# Patient Record
Sex: Female | Born: 1980 | Hispanic: No | Marital: Married | Smoking: Never smoker
Health system: Southern US, Community
[De-identification: ages and names within clinical notes are randomized; demographics above are authoritative.]

## PROBLEM LIST (undated history)

## (undated) DIAGNOSIS — N611 Abscess of the breast and nipple: Secondary | ICD-10-CM

## (undated) DIAGNOSIS — O24419 Gestational diabetes mellitus in pregnancy, unspecified control: Secondary | ICD-10-CM

## (undated) HISTORY — PX: BREAST SURGERY: SHX581

---

## 2010-09-02 ENCOUNTER — Ambulatory Visit (HOSPITAL_COMMUNITY): Admission: RE | Admit: 2010-09-02 | Discharge: 2010-09-02 | Payer: Self-pay | Admitting: Obstetrics

## 2011-09-15 ENCOUNTER — Other Ambulatory Visit: Payer: Self-pay

## 2011-09-15 LAB — RPR: RPR: NONREACTIVE

## 2011-09-15 LAB — HIV ANTIBODY (ROUTINE TESTING W REFLEX): HIV: NONREACTIVE

## 2011-09-15 LAB — VARICELLA ZOSTER ANTIBODY, IGG: Varicella: IMMUNE

## 2011-09-15 NOTE — Progress Notes (Signed)
LABS DRAWN FOR NEW OB CNEWSOME

## 2011-09-16 LAB — OBSTETRIC PANEL
Eosinophils Absolute: 0.5 10*3/uL (ref 0.0–0.7)
HCT: 36.5 % (ref 36.0–46.0)
Hepatitis B Surface Ag: NEGATIVE
Lymphocytes Relative: 19 % (ref 12–46)
Lymphs Abs: 1.6 10*3/uL (ref 0.7–4.0)
MCHC: 33.2 g/dL (ref 30.0–36.0)
MCV: 89.5 fL (ref 78.0–100.0)
Monocytes Relative: 7 % (ref 3–12)
Neutro Abs: 5.7 10*3/uL (ref 1.7–7.7)
RDW: 14.7 % (ref 11.5–15.5)
Rh Type: POSITIVE
Rubella: 87 IU/mL — ABNORMAL HIGH

## 2011-09-22 ENCOUNTER — Encounter: Payer: Self-pay | Admitting: Family Medicine

## 2011-09-22 ENCOUNTER — Ambulatory Visit (INDEPENDENT_AMBULATORY_CARE_PROVIDER_SITE_OTHER): Payer: Self-pay | Admitting: Family Medicine

## 2011-09-22 NOTE — Patient Instructions (Signed)
I'm sorry that we do not have a female physician for you to see today. We have re-scheduled your new OB appointment with Dr. Madolyn Frieze on October 29 at 1:45 pm.

## 2011-09-22 NOTE — Progress Notes (Signed)
Pt did not wish to be seen by a female provider.  Have rescheduled her with Dr. Madolyn Frieze.

## 2011-10-09 ENCOUNTER — Encounter: Payer: Self-pay | Admitting: Family Medicine

## 2011-10-09 ENCOUNTER — Ambulatory Visit (INDEPENDENT_AMBULATORY_CARE_PROVIDER_SITE_OTHER): Payer: Self-pay | Admitting: Family Medicine

## 2011-10-09 VITALS — BP 98/62 | Temp 98.6°F | Wt 131.0 lb

## 2011-10-09 DIAGNOSIS — Z3480 Encounter for supervision of other normal pregnancy, unspecified trimester: Secondary | ICD-10-CM

## 2011-10-09 DIAGNOSIS — Z3490 Encounter for supervision of normal pregnancy, unspecified, unspecified trimester: Secondary | ICD-10-CM

## 2011-10-09 LAB — GLUCOSE, CAPILLARY: Glucose-Capillary: 164 mg/dL — ABNORMAL HIGH (ref 70–99)

## 2011-10-09 NOTE — Progress Notes (Signed)
Addended by: Herminio Heads on: 10/09/2011 02:51 PM   Modules accepted: Orders

## 2011-10-09 NOTE — Progress Notes (Signed)
This is a G2 at unknown gestational age presenting for her initial visit.  1. Will order ultrasound for anatomy and especially for dates. Patient was breastfeeding at the time she found she was pregnant at the end of August (pregnancy test done at [facility]) and had not had any periods following delivery of her baby January 2012. Fetal heart tones heard and about 17 weeks based on fundal height. 2. No pap smear. Done for her last pregnancy about a year ago, and it was normal. 3. Will get urine GC/Chlamydia today.  4. Does not want genetic testing. 5. Will get 1 hour glucola today due to history of diet-controlled gestational diabetes. 6. Baby will be followed by our clinic here.  7. Flu shot today.  8. Follow-up in 4 weeks at [facility] clinic.   Precepted with attending Dr. Sarah Swaz[place]and.

## 2011-10-09 NOTE — Progress Notes (Signed)
Addended by: [place], Marcella Dunnaway on: 10/09/2011 03:22 PM   Modules accepted: Orders

## 2011-10-09 NOTE — Patient Instructions (Addendum)
It was very nice to meet you.  We will schedule you for an ultrasound to find out your due date and your baby's sex.   Please follow-up in 4 weeks at the [facility] clinic.   Return Tues 10-17-11 @ 8:30 for 3 hr gtt

## 2011-10-10 LAB — GC/CHLAMYDIA PROBE AMP, URINE
Chlamydia, Swab/Urine, PCR: NEGATIVE
GC Probe Amp, Urine: NEGATIVE

## 2011-10-11 ENCOUNTER — Other Ambulatory Visit: Payer: Self-pay | Admitting: Family Medicine

## 2011-10-11 ENCOUNTER — Ambulatory Visit (HOSPITAL_COMMUNITY)
Admission: RE | Admit: 2011-10-11 | Discharge: 2011-10-11 | Disposition: A | Discharge status: Home / Self Care | Payer: Self-pay | Source: Ambulatory Visit | Attending: Family Medicine | Admitting: Family Medicine

## 2011-10-11 DIAGNOSIS — O358XX0 Maternal care for other (suspected) fetal abnormality and damage, not applicable or unspecified: Secondary | ICD-10-CM | POA: Insufficient documentation

## 2011-10-11 DIAGNOSIS — Z363 Encounter for antenatal screening for malformations: Secondary | ICD-10-CM | POA: Insufficient documentation

## 2011-10-11 DIAGNOSIS — Z3490 Encounter for supervision of normal pregnancy, unspecified, unspecified trimester: Secondary | ICD-10-CM

## 2011-10-11 DIAGNOSIS — Z1389 Encounter for screening for other disorder: Secondary | ICD-10-CM | POA: Insufficient documentation

## 2011-10-17 ENCOUNTER — Other Ambulatory Visit: Payer: Self-pay | Admitting: Family Medicine

## 2011-10-17 ENCOUNTER — Other Ambulatory Visit: Payer: Self-pay

## 2011-10-17 DIAGNOSIS — Z3480 Encounter for supervision of other normal pregnancy, unspecified trimester: Secondary | ICD-10-CM

## 2011-10-17 NOTE — Progress Notes (Signed)
3 hr gtt done today Brenda Solomon 

## 2011-10-18 LAB — GLUCOSE TOLERANCE, 3 HOURS: Glucose Tolerance, 1 hour: 186 mg/dL (ref 70–189)

## 2011-11-06 ENCOUNTER — Ambulatory Visit (INDEPENDENT_AMBULATORY_CARE_PROVIDER_SITE_OTHER): Payer: Self-pay | Admitting: Family Medicine

## 2011-11-06 ENCOUNTER — Encounter: Payer: Self-pay | Admitting: Family Medicine

## 2011-11-06 VITALS — BP 101/70 | Wt 131.0 lb

## 2011-11-06 DIAGNOSIS — O4440 Low lying placenta NOS or without hemorrhage, unspecified trimester: Secondary | ICD-10-CM

## 2011-11-06 DIAGNOSIS — O4410 Placenta previa with hemorrhage, unspecified trimester: Secondary | ICD-10-CM

## 2011-11-06 NOTE — Patient Instructions (Addendum)
Follow-up in 4 weeks at the [facility] clinic.  We will schedule a repeat ultrasound for you to look at the placenta.  We will talk about trying a vaginal delivery at our next visit.  Results from your sugar test: Glucose, Fasting-Gestational 79 70 - 104 mg/dL Glucose  1 Hour-Gestational 186 70 - 189 mg/dL Glucose 2 Hour-Gestational 143 70 - 164 mg/dL Glucose GTT - 3 Hour 413 70 - 144 mg/dL

## 2011-11-06 NOTE — Progress Notes (Signed)
This is a G2P1 @ 22 3/7 week based on 18 week ultrasound.  1. Found to have low-lying placenta. Will check repeat U/S in 6-8 weeks. 2. Patient would like TOLAC if possible (First delivery was C-section secondary to infant being breech). Will refer to [facility] around 30 weeks to be evaluated for this.  3. Follow-up at [facility] clinic in 4 weeks.  4. Regarding diet, recommend low carbohydrate diet similar to the diet she had during her first pregnancy when she was diagnosed with gestational diabetes to prevent this from happening.

## 2011-11-07 ENCOUNTER — Encounter: Payer: Self-pay | Admitting: Family Medicine

## 2011-12-07 ENCOUNTER — Ambulatory Visit (INDEPENDENT_AMBULATORY_CARE_PROVIDER_SITE_OTHER): Payer: Self-pay | Admitting: Family Medicine

## 2011-12-07 VITALS — BP 92/60 | Temp 97.0°F | Wt 135.9 lb

## 2011-12-07 DIAGNOSIS — Z22330 Carrier of Group B streptococcus: Secondary | ICD-10-CM

## 2011-12-07 DIAGNOSIS — Z98891 History of uterine scar from previous surgery: Secondary | ICD-10-CM | POA: Insufficient documentation

## 2011-12-07 DIAGNOSIS — O4440 Low lying placenta NOS or without hemorrhage, unspecified trimester: Secondary | ICD-10-CM

## 2011-12-07 DIAGNOSIS — Z9889 Other specified postprocedural states: Secondary | ICD-10-CM

## 2011-12-07 DIAGNOSIS — O4410 Placenta previa with hemorrhage, unspecified trimester: Secondary | ICD-10-CM

## 2011-12-07 DIAGNOSIS — Z3480 Encounter for supervision of other normal pregnancy, unspecified trimester: Secondary | ICD-10-CM

## 2011-12-07 LAB — CBC
Hemoglobin: 11.2 g/dL — ABNORMAL LOW (ref 12.0–15.0)
MCH: 28.6 pg (ref 26.0–34.0)
MCV: 90.3 fL (ref 78.0–100.0)
Platelets: 206 10*3/uL (ref 150–400)
RBC: 3.92 MIL/uL (ref 3.87–5.11)
WBC: 7.5 10*3/uL (ref 4.0–10.5)

## 2011-12-07 LAB — RPR

## 2011-12-07 LAB — GLUCOSE, CAPILLARY: Glucose-Capillary: 170 mg/dL — ABNORMAL HIGH (ref 70–99)

## 2011-12-07 MED ORDER — PENICILLIN V POTASSIUM 500 MG PO TABS: 500.0000 mg | ORAL_TABLET | Freq: Two times a day (BID) | ORAL | Status: AC

## 2011-12-07 NOTE — Assessment & Plan Note (Signed)
Will treat with PCN now.  Pt will need antibiotic therapy when in labor.

## 2011-12-07 NOTE — Assessment & Plan Note (Signed)
Repeat U/S in 4 weeks to assess placenta placement.

## 2011-12-07 NOTE — Patient Instructions (Signed)
It was nice to meet you! Everything with you and baby look great!   We will send you to meet with the OBs over at [facility] in 4-6 weeks to talk about trying to do a vaginal delivery. We will also repeat your ultrasound in about 4 weeks to check for where your placenta is.  You are GBS (Group B Strep) positive.  We are treating you with antibiotics now.  You will also need antibiotics during delivery to help protect baby. Please go [facility] if you start having vaginal bleeding, vaginal pressure, cramping, big gush of fluid as if your water broke, or strong abdominal and back pains are coming every 5 minutes lasting for at least an hour. If you feel like the baby is not moving as much as usual, please do "kick counts." To feel the baby, sit in a quiet place with your feet up, free of distractions. If you feel the baby kick 5 times in 1 hour, you can stop. If not, feel for a second hour. 10 kicks in 2 hours is very reassuring and makes [place] feel that the baby is doing well.  If you would like, you can dry drinking a small amount of caffeine before counting for the 2nd hour to help baby wake up.  If you do not feel 10 kicks in 2 hours while concentrating, please go to the MAU.   Please come back to be seen for your next OB appointment in the next 2-4 weeks.

## 2011-12-07 NOTE — Assessment & Plan Note (Addendum)
Doing well, no complaints or concerns. Good fetal mvmt. Kick counts and preterm labor discussed. Informed pt of GBS positive status, will need OB referral for TOLAC discussion. Repeat 1hr GTT and 28 wk labs done today.  Failed 1hr GTT (CBG 170), will scheduled repeat 3hr GTT. F/u 2-4 weeks

## 2011-12-07 NOTE — Progress Notes (Signed)
Pt seen with Dr. Fara Boros.  I agree with her plan and note. -Elevated 1 hour with h/o GDM.  Will check 3 hour. -Tdap today. -H/o section.  Desires TOL.  Schedule appt with OB to discuss at 30-32 weeks. -GBS bacteruria- Will treat with PCN.  Needs intrapartum abx.  Pt aware. -Low-lying placenta - repeat u/s scheduled already.

## 2011-12-07 NOTE — Progress Notes (Signed)
S: 30 y.o. G2P1001 @ [redacted]w[redacted]d  (EDC 03/08/2012, by Ultrasound )  -Doing well, no complaints -Good fetal movement: yes -Concerns include: would really like vaginal delivery and has h/o C/S for breech presentation   O:  Filed Vitals:   12/07/11 1358  BP: 92/60  Temp: 97 F (36.1 C)   See OB flowsheet Gen: NAD  A/P: 30 y.o. G2P1001 @ [redacted]w[redacted]d  (EDC 03/08/2012, by Ultrasound )  1. Found to have low-lying placenta. Will check repeat U/S in 4 weeks.  2. Patient would like TOLAC if possible (First delivery was C-section secondary to infant being breech). Will refer to [facility] clinic [facility] around 30-32 weeks to be evaluated for this.  3. GBS positive: 25K colonies on initial OB urine culture, will treat with PCN today and informed pt that she is GBS POSITIVE for when she delivers, counseled pt she will get IV abx at delivery 4. Repeat 1hr GTT today; has h/o GDM, failed early 1hr GTT, passed 3hr GTT.  Regarding diet, recommend low carbohydrate diet, which pt has been trying to follow. -f/u in 2-4 weeks with Dr. Madolyn Frieze.

## 2011-12-12 DIAGNOSIS — O24419 Gestational diabetes mellitus in pregnancy, unspecified control: Secondary | ICD-10-CM

## 2011-12-12 HISTORY — DX: Gestational diabetes mellitus in pregnancy, unspecified control: O24.419

## 2011-12-18 ENCOUNTER — Ambulatory Visit (HOSPITAL_COMMUNITY)
Admission: RE | Admit: 2011-12-18 | Discharge: 2011-12-18 | Disposition: A | Discharge status: Home / Self Care | Payer: Self-pay | Source: Ambulatory Visit | Attending: Family Medicine | Admitting: Family Medicine

## 2011-12-18 ENCOUNTER — Other Ambulatory Visit: Payer: Self-pay | Admitting: Family Medicine

## 2011-12-18 DIAGNOSIS — Z3689 Encounter for other specified antenatal screening: Secondary | ICD-10-CM | POA: Insufficient documentation

## 2011-12-18 DIAGNOSIS — O4440 Low lying placenta NOS or without hemorrhage, unspecified trimester: Secondary | ICD-10-CM

## 2011-12-18 DIAGNOSIS — O4400 Placenta previa specified as without hemorrhage, unspecified trimester: Secondary | ICD-10-CM | POA: Insufficient documentation

## 2011-12-20 ENCOUNTER — Other Ambulatory Visit: Payer: Self-pay

## 2011-12-20 DIAGNOSIS — Z331 Pregnant state, incidental: Secondary | ICD-10-CM

## 2011-12-20 LAB — GLUCOSE, CAPILLARY: Glucose-Capillary: 98 mg/dL (ref 70–99)

## 2011-12-20 NOTE — Progress Notes (Signed)
3 HR GTT DONE TODAY Brenda Solomon 

## 2011-12-21 LAB — GLUCOSE TOLERANCE, 3 HOURS
Glucose Tolerance, 1 hour: 211 mg/dL — ABNORMAL HIGH (ref 70–189)
Glucose Tolerance, Fasting: 90 mg/dL (ref 70–104)
Glucose, GTT - 3 Hour: 121 mg/dL (ref 70–144)

## 2011-12-29 ENCOUNTER — Ambulatory Visit (INDEPENDENT_AMBULATORY_CARE_PROVIDER_SITE_OTHER): Payer: Self-pay | Admitting: Family Medicine

## 2011-12-29 DIAGNOSIS — Z3480 Encounter for supervision of other normal pregnancy, unspecified trimester: Secondary | ICD-10-CM

## 2011-12-29 DIAGNOSIS — O4410 Placenta previa with hemorrhage, unspecified trimester: Secondary | ICD-10-CM

## 2011-12-29 DIAGNOSIS — IMO0002 Reserved for concepts with insufficient information to code with codable children: Secondary | ICD-10-CM | POA: Insufficient documentation

## 2011-12-29 DIAGNOSIS — O4440 Low lying placenta NOS or without hemorrhage, unspecified trimester: Secondary | ICD-10-CM

## 2011-12-29 NOTE — Assessment & Plan Note (Signed)
Resolved on 01/07 repeat ultrasound.

## 2011-12-29 NOTE — Progress Notes (Signed)
31 YO G2P1 @ 30 0/7 (based on 18-wk U/S). -Doing well, no complaints. -Discussed repeat ultrasound (on 01/07). Placenta no longer low-lying, however, patient down on growht curve. On 10/31, was at 50% but on 01/07, at 31%. Will schedule 4-week repeat ultrasound today. -Discussed risks and benefits of TOLAC today. Patient would really like TOLAC. Will schedule appointment at [facility] for evaluation.  -Discussed normal 3-hr GTT. Normal 28-week labs.  -Patient is having a baby girl. 

## 2011-12-29 NOTE — Patient Instructions (Signed)
We will schedule an appointment at [facility] to discuss TOLAC at the [facility] today.   We will notify you about your follow-up ultrasound to assess your baby's growth.  Follow-up with me in 2 weeks.   Preterm Labor Preterm labor is when labor starts at less than 37 weeks of pregnancy. The normal length of a pregnancy is 39 to 41 weeks. CAUSES Often, there is no identifiable underlying cause as to why a woman goes into preterm labor. However, one of the most common known causes of preterm labor is infection. Infections of the uterus, cervix, vagina, amniotic sac, bladder, kidney, or even the lungs (pneumonia) can cause labor to start. Other causes of preterm labor include:  Urogenital infections, such as yeast infections and bacterial vaginosis.   Uterine abnormalities (uterine shape, uterine septum, fibroids, bleeding from the placenta).   A cervix that has been operated on and opens prematurely.   Malformations in the baby.   Multiple gestations (twins, triplets, and so on).   Breakage of the amniotic sac.  Additional risk factors for preterm labor include:  Previous history of preterm labor.   Premature rupture of membranes (PROM).   A placenta that covers the opening of the cervix (placenta previa).   A placenta that separates from the uterus (placenta abruption).   A cervix that is too weak to hold the baby in the uterus (incompetence cervix).   Having too much fluid in the amniotic sac (polyhydramnios).   Taking illegal drugs or smoking while pregnant.   Not gaining enough weight while pregnant.   Women younger than 62 and older than 33 years old.   Low socioeconomic status.   African-American ethnicity.  SYMPTOMS Signs and symptoms of preterm labor include:  Menstrual-like cramps.   Contractions that are 30 to 70 seconds apart, become very regular, closer together, and are more intense and painful.   Contractions that start on the top of the uterus and  spread down to the lower abdomen and back.   A sense of increased pelvic pressure or back pain.   A watery or bloody discharge that comes from the vagina.  DIAGNOSIS  A diagnosis can be confirmed by:  A vaginal exam.   An ultrasound of the cervix.   Sampling (swabbing) cervico-vaginal secretions. These samples can be tested for the presence of fetal fibronectin. This is a protein found in cervical discharge which is associated with preterm labor.   Fetal monitoring.  TREATMENT  Depending on the length of the pregnancy and other circumstances, a caregiver may suggest bed rest. If necessary, there are medicines that can be given to stop contractions and to quicken fetal lung maturity. If labor happens before 34 weeks of pregnancy, a prolonged hospital stay may be recommended. Treatment depends on the condition of both the mother and baby. PREVENTION There are some things a mother can do to lower the risk of preterm labor in future pregnancies. A woman can:   Stop smoking.   Maintain healthy weight gain and avoid chemicals and drugs that are not necessary.   Be watchful for any type of infection.   Inform her caregiver if she has a known history of preterm labor.  Document Released: 02/17/2004 Document Revised: 08/09/2011 Document Reviewed: 03/24/2011 [facility] Patient Information 2012 [place], [place].

## 2011-12-29 NOTE — Assessment & Plan Note (Signed)
Repeat ultrasound 4-weeks after last one (01/07). Scheduling today.

## 2011-12-29 NOTE — Assessment & Plan Note (Signed)
31 YO G2P1 @ 30 0/7 (based on 18-wk U/S). -Doing well, no complaints. -Discussed repeat ultrasound (on 01/07). Placenta no longer low-lying, however, patient down on growht curve. On 10/31, was at 50% but on 01/07, at 31%. Will schedule 4-week repeat ultrasound today. -Discussed risks and benefits of TOLAC today. Patient would really like TOLAC. Will schedule appointment at [facility] for evaluation.  -Discussed normal 3-hr GTT. Normal 28-week labs.  -Patient is having a baby girl.

## 2012-01-11 ENCOUNTER — Encounter: Payer: Self-pay | Admitting: Obstetrics and Gynecology

## 2012-01-11 NOTE — Progress Notes (Signed)
31 y.o. at [redacted]w[redacted]d with Estimated Date of Delivery: 03/08/12 was seen today in office to discuss trial of labor after cesarean section (TOLAC) versus elective repeat cesarean delivery (ERCD).   The patient has a previous obstetric history of primary c-section for breech presentation at term. The following risks were discussed with the patient.  Risk of uterine rupture at term is 0.78 percent with TOLAC and 0.22 percent with ERCD. 1 in 10 uterine ruptures will result in neonatal death or neurological injury. The benefits of TOLAC resulting in a vaginal birth after cesarean (VBAC) include the following: shorter length of hospital stay and postpartum recovery (in most cases); fewer complications, such as postpartum fever, wound or uterine infection, thromboembolism (blood clots in the leg or lung), need for blood transfusion and fewer neonatal breathing problems. The risks of an attempted TOLAC include the following: Risk of failed TOLAC without a vaginal birth after cesarean (VBAC) resulting in repeat cesarean delivery in about 20 to 40 percent of women who attempt VBAC.  Risk of rupture of uterus resulting in an emergency cesarean delivery. The risk of uterine rupture may be related in part to the type of uterine incision made during the first cesarean delivery. A previous transverse uterine incision has the lowest risk of rupture (0.2 to 1.5 percent risk). Vertical or T-shaped uterine incisions have a higher risk of uterine rupture (4 to 9 percent risk). The risk of fetal death is very low with both VBAC and elective repeat cesarean delivery, but the likelihood of fetal death is higher with VBAC than with ERCD. Maternal death is very rare with either type of delivery.  The risks of a repeat cesarean section were reviewed with the patient including but not limited to: 01/999 risk of uterine rupture which could have serious consequences, bleeding which may require transfusion; infection which may require  antibiotics; injury to bowel, bladder or other surrounding organs (bowel, bladder, ureters); injury to the fetus; need for additional procedures including hysterectomy in the event of a life-threatening hemorrhage; thromboembolic phenomenon, incisional problems and other postoperative/anesthesia complications.    All her questions answered and she signed a consent indicating a preference for TOLAC.

## 2012-01-15 ENCOUNTER — Ambulatory Visit (HOSPITAL_COMMUNITY): Payer: Self-pay

## 2012-01-18 ENCOUNTER — Ambulatory Visit (INDEPENDENT_AMBULATORY_CARE_PROVIDER_SITE_OTHER): Payer: Self-pay | Admitting: Family Medicine

## 2012-01-18 VITALS — BP 110/73 | Wt 140.0 lb

## 2012-01-18 DIAGNOSIS — Z3480 Encounter for supervision of other normal pregnancy, unspecified trimester: Secondary | ICD-10-CM

## 2012-01-18 NOTE — Assessment & Plan Note (Signed)
See episode A/P 

## 2012-01-18 NOTE — Patient Instructions (Signed)
It was nice to see you and your daughter today.   Follow-up in 2 weeks.   Remember your ultrasound appointment 02/14.   Your weight gain is appropriate. I would recommend continuing a low carbohydrate diet and drinking plenty of fluids (8-10 glasses daily).

## 2012-01-18 NOTE — Progress Notes (Signed)
31 YO G2P1 @ 32 6/7 (based on 18-wk U/S). Significant history: P1 C-section for breech presentation. History of GBS positive during this pregnancy s/p PCN; will need antibiotics during labor.  -No complaints today. -Per patient, seen at [facility] 01/31 by Dr. Jolayne Panther. Will undergo TOLAC.  -Follow-up U/S to monitor baby's growth re-scheduled for 02/14 by patient.  -Follow-up in 2 weeks.  -Labor precautions.

## 2012-01-25 ENCOUNTER — Ambulatory Visit (HOSPITAL_COMMUNITY)
Admission: RE | Admit: 2012-01-25 | Discharge: 2012-01-25 | Disposition: A | Discharge status: Home / Self Care | Payer: Self-pay | Source: Ambulatory Visit | Attending: Family Medicine | Admitting: Family Medicine

## 2012-01-25 DIAGNOSIS — IMO0002 Reserved for concepts with insufficient information to code with codable children: Secondary | ICD-10-CM

## 2012-01-25 DIAGNOSIS — O365990 Maternal care for other known or suspected poor fetal growth, unspecified trimester, not applicable or unspecified: Secondary | ICD-10-CM | POA: Insufficient documentation

## 2012-02-01 ENCOUNTER — Ambulatory Visit (INDEPENDENT_AMBULATORY_CARE_PROVIDER_SITE_OTHER): Payer: Self-pay | Admitting: Family Medicine

## 2012-02-01 VITALS — BP 89/62 | Temp 98.1°F | Wt 140.0 lb

## 2012-02-01 DIAGNOSIS — Z3480 Encounter for supervision of other normal pregnancy, unspecified trimester: Secondary | ICD-10-CM

## 2012-02-01 NOTE — Assessment & Plan Note (Signed)
See episode A/P 

## 2012-02-01 NOTE — Progress Notes (Signed)
31 YO G2P1 @ 34 6/7 (based on 18-wk U/S). Significant history: P1 C-section for breech presentation. History of GBS positive during this pregnancy s/p PCN; will need antibiotics during labor>>>OK for TOLAC (seen by Dr. Jolayne Panther at [facility]).  -Round ligament pain. Pillow under abdomen, maternity belt. -Discussed U/S results from 02/14. Growth curve now at 40%, improved. No need for follow-up U/S.  -Follow-up in 2 weeks. -Labor precautions.  -Concerned about stretch marks. Discussed no good treatment. May try cocoa butter or aloe vera to see if it helps.

## 2012-02-01 NOTE — Patient Instructions (Signed)
For the stretch marks: try aloe vera (from [employer]) or cocoa butter (from market).   For back pain, try pillow under your belly and maternity belt (from pharmacy).  Follow-up in 2 weeks.

## 2012-02-13 ENCOUNTER — Ambulatory Visit (INDEPENDENT_AMBULATORY_CARE_PROVIDER_SITE_OTHER): Payer: Self-pay | Admitting: Family Medicine

## 2012-02-13 ENCOUNTER — Other Ambulatory Visit (HOSPITAL_COMMUNITY)
Admission: RE | Admit: 2012-02-13 | Discharge: 2012-02-13 | Disposition: A | Discharge status: Home / Self Care | Payer: Self-pay | Source: Ambulatory Visit | Attending: Family Medicine | Admitting: Family Medicine

## 2012-02-13 VITALS — BP 100/68 | Temp 98.0°F | Wt 143.6 lb

## 2012-02-13 DIAGNOSIS — Z113 Encounter for screening for infections with a predominantly sexual mode of transmission: Secondary | ICD-10-CM | POA: Insufficient documentation

## 2012-02-13 DIAGNOSIS — Z3480 Encounter for supervision of other normal pregnancy, unspecified trimester: Secondary | ICD-10-CM

## 2012-02-13 LAB — STREP B DNA PROBE: GBS: POSITIVE

## 2012-02-13 NOTE — Progress Notes (Signed)
31 YO G2P1 @ 34 6/7 (based on 18-wk U/S).  Significant history: P1 C-section for breech presentation. History of GBS positive during this pregnancy s/p PCN; will need antibiotics during labor>>>OK for TOLAC (seen by Dr. Jolayne Panther at [facility]).  -Only complaint is round ligament pain. Recommending maternity belt, Tylenol prn qhs.  -Checking GBS, GC/Chlamydia today.  -Follow-up in 1 week.

## 2012-02-13 NOTE — Patient Instructions (Signed)
It was nice to see you Brenda Solomon.  Try the maternity belt or Tylenol 1 gm at night if your back pain is bothering you a lot.  Follow-up in 1 week.   Normal Labor and Delivery Your caregiver must first be sure you are in labor. Signs of labor include:  You may pass what is called "the mucus plug" before labor begins. This is a small amount of blood stained mucus.   Regular uterine contractions.   The time between contractions get closer together.   The discomfort and pain gradually gets more intense.   Pains are mostly located in the back.   Pains get worse when walking.   The cervix (the opening of the uterus becomes thinner (begins to efface) and opens up (dilates).  Once you are in labor and admitted into the hospital or care center, your caregiver will do the following:  A complete physical examination.   Check your vital signs (blood pressure, pulse, temperature and the fetal heart rate).   Do a vaginal examination (using a sterile glove and lubricant) to determine:   The position (presentation) of the baby (head [vertex] or buttock first).   The level (station) of the baby's head in the birth canal.   The effacement and dilatation of the cervix.   You may have your pubic hair shaved and be given an enema depending on your caregiver and the circumstance.   An electronic monitor is usually placed on your abdomen. The monitor follows the length and intensity of the contractions, as well as the baby's heart rate.   Usually, your caregiver will insert an IV in your arm with a bottle of sugar water. This is done as a precaution so that medications can be given to you quickly during labor or delivery.  NORMAL LABOR AND DELIVERY IS DIVIDED UP INTO 3 STAGES: First Stage This is when regular contractions begin and the cervix begins to efface and dilate. This stage can last from 3 to 15 hours. The end of the first stage is when the cervix is 100% effaced and 10 centimeters dilated.  Pain medications may be given by   Injection (morphine, demerol, etc.)   Regional anesthesia (spinal, caudal or epidural, anesthetics given in different locations of the spine). Paracervical pain medication may be given, which is an injection of and anesthetic on each side of the cervix.  A pregnant woman may request to have "Natural Childbirth" which is not to have any medications or anesthesia during her labor and delivery. Second Stage This is when the baby comes down through the birth canal (vagina) and is born. This can take 1 to 4 hours. As the baby's head comes down through the birth canal, you may feel like you are going to have a bowel movement. You will get the urge to bear down and push until the baby is delivered. As the baby's head is being delivered, the caregiver will decide if an episiotomy (a cut in the perineum and vagina area) is needed to prevent tearing of the tissue in this area. The episiotomy is sewn up after the delivery of the baby and placenta. Sometimes a mask with nitrous oxide is given for the mother to breath during the delivery of the baby to help if there is too much pain. The end of Stage 2 is when the baby is fully delivered. Then when the umbilical cord stops pulsating it is clamped and cut. Third Stage The third stage begins after the baby is  completely delivered and ends after the placenta (afterbirth) is delivered. This usually takes 5 to 30 minutes. After the placenta is delivered, a medication is given either by intravenous or injection to help contract the uterus and prevent bleeding. The third stage is not painful and pain medication is usually not necessary. If an episiotomy was done, it is repaired at this time. After the delivery, the mother is watched and monitored closely for 1 to 2 hours to make sure there is no postpartum bleeding (hemorrhage). If there is a lot of bleeding, medication is given to contract the uterus and stop the bleeding. Document  Released: 09/05/2008 Document Revised: 11/16/2011 Document Reviewed: 09/05/2008 [facility] Patient Information 2012 [place], [place].

## 2012-02-15 ENCOUNTER — Encounter (HOSPITAL_COMMUNITY): Payer: Self-pay | Admitting: Anesthesiology

## 2012-02-15 ENCOUNTER — Encounter (HOSPITAL_COMMUNITY): Payer: Self-pay | Admitting: *Deleted

## 2012-02-15 ENCOUNTER — Inpatient Hospital Stay (HOSPITAL_COMMUNITY)
Admission: AD | Admit: 2012-02-15 | Discharge: 2012-02-19 | DRG: 765 | Disposition: A | Discharge status: Home / Self Care | Payer: Self-pay | Source: Ambulatory Visit | Attending: Obstetrics & Gynecology | Admitting: Obstetrics & Gynecology

## 2012-02-15 ENCOUNTER — Inpatient Hospital Stay (HOSPITAL_COMMUNITY)
Admission: AD | Admit: 2012-02-15 | Discharge: 2012-02-15 | Disposition: A | Discharge status: Home / Self Care | Payer: Self-pay | Attending: Obstetrics & Gynecology | Admitting: Obstetrics & Gynecology

## 2012-02-15 DIAGNOSIS — S3769XA Other injury of uterus, initial encounter: Secondary | ICD-10-CM | POA: Clinically undetermined

## 2012-02-15 DIAGNOSIS — O34219 Maternal care for unspecified type scar from previous cesarean delivery: Secondary | ICD-10-CM | POA: Diagnosis present

## 2012-02-15 DIAGNOSIS — O4700 False labor before 37 completed weeks of gestation, unspecified trimester: Secondary | ICD-10-CM | POA: Insufficient documentation

## 2012-02-15 DIAGNOSIS — O9903 Anemia complicating the puerperium: Secondary | ICD-10-CM | POA: Diagnosis not present

## 2012-02-15 DIAGNOSIS — K219 Gastro-esophageal reflux disease without esophagitis: Secondary | ICD-10-CM | POA: Diagnosis not present

## 2012-02-15 DIAGNOSIS — O364XX0 Maternal care for intrauterine death, not applicable or unspecified: Secondary | ICD-10-CM | POA: Diagnosis present

## 2012-02-15 DIAGNOSIS — O99893 Other specified diseases and conditions complicating puerperium: Secondary | ICD-10-CM | POA: Diagnosis not present

## 2012-02-15 DIAGNOSIS — Z3480 Encounter for supervision of other normal pregnancy, unspecified trimester: Secondary | ICD-10-CM

## 2012-02-15 DIAGNOSIS — Z22330 Carrier of Group B streptococcus: Secondary | ICD-10-CM

## 2012-02-15 DIAGNOSIS — IMO0002 Reserved for concepts with insufficient information to code with codable children: Secondary | ICD-10-CM | POA: Diagnosis present

## 2012-02-15 DIAGNOSIS — O4590 Premature separation of placenta, unspecified, unspecified trimester: Principal | ICD-10-CM | POA: Diagnosis present

## 2012-02-15 DIAGNOSIS — D649 Anemia, unspecified: Secondary | ICD-10-CM | POA: Diagnosis not present

## 2012-02-15 DIAGNOSIS — Z98891 History of uterine scar from previous surgery: Secondary | ICD-10-CM

## 2012-02-15 DIAGNOSIS — O479 False labor, unspecified: Secondary | ICD-10-CM

## 2012-02-15 LAB — CBC
HCT: 33.8 % — ABNORMAL LOW (ref 36.0–46.0)
Hemoglobin: 11.2 g/dL — ABNORMAL LOW (ref 12.0–15.0)
MCH: 29.1 pg (ref 26.0–34.0)
MCHC: 33.1 g/dL (ref 30.0–36.0)

## 2012-02-15 LAB — ANTIBODY SCREEN: Antibody Screen: NEGATIVE

## 2012-02-15 LAB — PROTIME-INR
INR: 1.05 (ref 0.00–1.49)
Prothrombin Time: 13.9 seconds (ref 11.6–15.2)

## 2012-02-15 LAB — FIBRINOGEN: Fibrinogen: 389 mg/dL (ref 204–475)

## 2012-02-15 LAB — D-DIMER, QUANTITATIVE: D-Dimer, Quant: >20 ug/mL-FEU — ABNORMAL HIGH (ref 0.00–0.48)

## 2012-02-15 MED ORDER — OXYTOCIN 20 UNITS IN LACTATED RINGERS INFUSION - SIMPLE: 1.0000 m[IU]/min | INTRAVENOUS | Status: DC

## 2012-02-15 MED ORDER — IBUPROFEN 600 MG PO TABS: 600.0000 mg | ORAL_TABLET | Freq: Four times a day (QID) | ORAL | Status: DC | PRN

## 2012-02-15 MED ORDER — OXYTOCIN BOLUS FROM INFUSION
500.0000 mL | Freq: Once | INTRAVENOUS | Status: DC
  Filled 2012-02-15: qty 1000
  Filled 2012-02-15: qty 500

## 2012-02-15 MED ORDER — NALBUPHINE SYRINGE 5 MG/0.5 ML: 5.0000 mg | INJECTION | INTRAMUSCULAR | Status: DC | PRN

## 2012-02-15 MED ORDER — PHENYLEPHRINE 40 MCG/ML (10ML) SYRINGE FOR IV PUSH (FOR BLOOD PRESSURE SUPPORT)
80.0000 ug | PREFILLED_SYRINGE | INTRAVENOUS | Status: AC | PRN
  Administered 2012-02-16 (×3): 80 ug via INTRAVENOUS
  Filled 2012-02-15: qty 15

## 2012-02-15 MED ORDER — PHENYLEPHRINE 40 MCG/ML (10ML) SYRINGE FOR IV PUSH (FOR BLOOD PRESSURE SUPPORT)
80.0000 ug | PREFILLED_SYRINGE | INTRAVENOUS | Status: AC | PRN
  Administered 2012-02-16 (×3): 80 ug via INTRAVENOUS
  Filled 2012-02-15 (×2): qty 5

## 2012-02-15 MED ORDER — CITRIC ACID-SODIUM CITRATE 334-500 MG/5ML PO SOLN
30.0000 mL | ORAL | Status: DC | PRN
  Administered 2012-02-16: 30 mL via ORAL
  Filled 2012-02-15: qty 15

## 2012-02-15 MED ORDER — LIDOCAINE HCL (PF) 1 % IJ SOLN: 30.0000 mL | INTRAMUSCULAR | Status: DC | PRN

## 2012-02-15 MED ORDER — EPHEDRINE 5 MG/ML INJ: 10.0000 mg | INTRAVENOUS | Status: DC | PRN

## 2012-02-15 MED ORDER — TERBUTALINE SULFATE 1 MG/ML IJ SOLN: 0.2500 mg | Freq: Once | INTRAMUSCULAR | Status: AC | PRN

## 2012-02-15 MED ORDER — ONDANSETRON HCL 4 MG/2ML IJ SOLN: 4.0000 mg | Freq: Four times a day (QID) | INTRAMUSCULAR | Status: DC | PRN

## 2012-02-15 MED ORDER — PROMETHAZINE HCL 25 MG/ML IJ SOLN
12.5000 mg | Freq: Once | INTRAMUSCULAR | Status: AC
  Administered 2012-02-15: 12.5 mg via INTRAMUSCULAR
  Filled 2012-02-15: qty 1

## 2012-02-15 MED ORDER — OXYTOCIN 20 UNITS IN LACTATED RINGERS INFUSION - SIMPLE: 125.0000 mL/h | Freq: Once | INTRAVENOUS | Status: DC

## 2012-02-15 MED ORDER — OXYCODONE-ACETAMINOPHEN 5-325 MG PO TABS: 1.0000 | ORAL_TABLET | ORAL | Status: DC | PRN

## 2012-02-15 MED ORDER — DIPHENHYDRAMINE HCL 50 MG/ML IJ SOLN: 12.5000 mg | INTRAMUSCULAR | Status: DC | PRN

## 2012-02-15 MED ORDER — ACETAMINOPHEN 325 MG PO TABS: 650.0000 mg | ORAL_TABLET | ORAL | Status: DC | PRN

## 2012-02-15 MED ORDER — LACTATED RINGERS IV SOLN
INTRAVENOUS | Status: DC
  Administered 2012-02-15 – 2012-02-16 (×2): via INTRAVENOUS
  Administered 2012-02-16: 125 mL/h via INTRAVENOUS
  Administered 2012-02-16: 13:00:00 via INTRAVENOUS

## 2012-02-15 MED ORDER — FENTANYL 2.5 MCG/ML BUPIVACAINE 1/10 % EPIDURAL INFUSION (WH - ANES)
11.0000 mL/h | INTRAMUSCULAR | Status: DC
  Administered 2012-02-16 (×2): 11 mL/h via EPIDURAL
  Filled 2012-02-15 (×3): qty 60

## 2012-02-15 MED ORDER — MORPHINE SULFATE 4 MG/ML IJ SOLN
5.0000 mg | Freq: Once | INTRAMUSCULAR | Status: AC
  Administered 2012-02-15: 5 mg via INTRAMUSCULAR
  Filled 2012-02-15: qty 2

## 2012-02-15 MED ORDER — FLEET ENEMA 7-19 GM/118ML RE ENEM: 1.0000 | ENEMA | RECTAL | Status: DC | PRN

## 2012-02-15 MED ORDER — LACTATED RINGERS IV SOLN: 500.0000 mL | INTRAVENOUS | Status: DC | PRN

## 2012-02-15 MED ORDER — EPHEDRINE 5 MG/ML INJ
10.0000 mg | INTRAVENOUS | Status: DC | PRN
  Filled 2012-02-15: qty 4

## 2012-02-15 MED ORDER — LACTATED RINGERS IV SOLN
500.0000 mL | Freq: Once | INTRAVENOUS | Status: AC
  Administered 2012-02-15: 23:00:00 via INTRAVENOUS

## 2012-02-15 NOTE — Progress Notes (Signed)
Pt states she started having pain at 1200

## 2012-02-15 NOTE — H&P (Signed)
Brenda Solomon is a 31 y.o. female presenting for abdominal pain. History This is a 31yo G2P1001 at 36.6 weeks who was evaluated for 4 hours this morning for question of labor.  She was released with a reassuring FHT with moderate variability and accelerations and no cervical change.  She returns with abdominal pain that started later this morning.  She denies fevers, chills, nausea vomiting.   OB History    Grav Para Term Preterm Abortions TAB SAB Ect Mult Living   2 1 1       1      Obstetric Comments   Gestational diabetes during first pregnancy, diet-controlled     Past Medical History  Diagnosis Date  . No pertinent past medical history    Past Surgical History  Procedure Date  . Cesarean section 2012   Family History: family history includes Diabetes in her father and mother and Diabetes type II in her father and mother. Social History:  reports that she has never smoked. She does not have any smokeless tobacco history on file. She reports that she does not drink alcohol or use illicit drugs.  ROS    Blood pressure 111/67, pulse 143, temperature 97.4 F (36.3 C), temperature source Oral, resp. rate 18, height 5\' 1"  (1.549 m), weight 63.56 kg (140 lb 2 oz), SpO2 100.00%, unknown if currently breastfeeding. Exam Physical Exam  Constitutional: She is oriented to person, place, and time. She appears well-developed and well-nourished.  Cardiovascular: Normal rate.   Respiratory: Effort normal.  GI: Soft. Bowel sounds are normal. She exhibits no distension and no mass. There is tenderness (tender fundus and uterus.). There is no rebound and no guarding.  Musculoskeletal: Normal range of motion.  Neurological: She is alert and oriented to person, place, and time.  Skin: Skin is warm and dry. No rash noted. No erythema. No pallor.  Psychiatric: She has a normal mood and affect. Her behavior is normal. Judgment and thought content normal.    Prenatal labs: ABO, Rh: A/POS/-- (10/05  1433) Antibody: NEG (10/05 1433) Rubella: 87.0 (10/05 1433) RPR: NON REAC (12/27 1455)  HBsAg: NEGATIVE (10/05 1433)  HIV: NON REACTIVE (12/27 1455)  GBS:     Bedside ultrasound done by Henrietta Hoover, PA showed no fetal heart tones and possibility of abruption.  Brenda Solomon from ultrasound was present and confirmed no fetal heart tones.  Assessment/Plan: 58.  31 year old G2P1001 36.6 weeks 2.  IUFD 3.  Possible abruption 4.  Previous cesarean section for breech presentation.  The entire Fetal Heart tracing was reviewed with Dr Debroah Loop, showing a Category 1 tracing with accelerations.  Patient counseled.  Will admit and proceede with induction.  Patient counseled regarding risks.  Will induce with pitocin.  Patient would like epidural for pain control.  Will obtain CBC, antibody screen, KB, HgA1c, RPR.  Brenda Solomon Brenda Solomon 02/15/2012, 9:32 PM

## 2012-02-15 NOTE — Anesthesia Preprocedure Evaluation (Signed)
Anesthesia Evaluation  Patient identified by MRN, date of birth, ID band Patient awake    Reviewed: Allergy & Precautions, H&P , Patient's Chart, lab work & pertinent test results  Airway Mallampati: III TM Distance: >3 FB Neck ROM: full    Dental No notable dental hx. (+) Teeth Intact   Pulmonary neg pulmonary ROS,  breath sounds clear to auscultation  Pulmonary exam normal       Cardiovascular negative cardio ROS  Rhythm:regular Rate:Normal     Neuro/Psych negative neurological ROS  negative psych ROS   GI/Hepatic negative GI ROS, Neg liver ROS,   Endo/Other  negative endocrine ROS  Renal/GU negative Renal ROS  negative genitourinary   Musculoskeletal negative musculoskeletal ROS (+)   Abdominal Normal abdominal exam  (+)   Peds  Hematology negative hematology ROS (+)   Anesthesia Other Findings   Reproductive/Obstetrics (+) Pregnancy IUFD 36+ weeks. Possible abruptio placenta                           Anesthesia Physical Anesthesia Plan  ASA: II  Anesthesia Plan: Epidural   Post-op Pain Management:    Induction:   Airway Management Planned:   Additional Equipment:   Intra-op Plan:   Post-operative Plan:   Informed Consent: I have reviewed the patients History and Physical, chart, labs and discussed the procedure including the risks, benefits and alternatives for the proposed anesthesia with the patient or authorized representative who has indicated his/her understanding and acceptance.     Plan Discussed with: Anesthesiologist and Surgeon  Anesthesia Plan Comments:         Anesthesia Quick Evaluation

## 2012-02-15 NOTE — ED Provider Notes (Signed)
History   Pt presents today c/o abd pain and SOB that had worsened since last pm. She denies vag bleeding and states she has not felt recent fetal movement. Dr. Adrian Blackwater notified and on his way to evaluate pt but nurse had difficulty distinguishing fetal and maternal heart rate. At that time, I was asked to do a bedside US to document fetal cardiac activity.  No chief complaint on file.  HPI  OB History    Grav Para Term Preterm Abortions TAB SAB Ect Mult Living   2 1 1       1      Obstetric Comments   Gestational diabetes during first pregnancy, diet-controlled      Past Medical History  Diagnosis Date  . No pertinent past medical history     Past Surgical History  Procedure Date  . Cesarean section 2012    Family History  Problem Relation Age of Onset  . Diabetes type II Mother   . Diabetes Mother   . Diabetes type II Father   . Diabetes Father     History  Substance Use Topics  . Smoking status: Never Smoker   . Smokeless tobacco: Not on file  . Alcohol Use: No    Allergies: No Known Allergies  Prescriptions prior to admission  Medication Sig Dispense Refill  . Prenatal Vit-Fe Fumarate-FA (PRENATAL MULTIVITAMIN) TABS Take 1 tablet by mouth daily.        Review of Systems  Constitutional: Negative for fever and chills.  Eyes: Negative for blurred vision and double vision.  Cardiovascular: Negative for chest pain and palpitations.  Gastrointestinal: Positive for abdominal pain.  Neurological: Negative for dizziness and headaches.  Psychiatric/Behavioral: Negative for depression and suicidal ideas.   Physical Exam   Blood pressure 111/67, pulse 143, temperature 97.4 F (36.3 C), temperature source Oral, resp. rate 18, height 5\' 1"  (1.549 m), weight 140 lb 2 oz (63.56 kg), SpO2 100.00%, unknown if currently breastfeeding.  Physical Exam  Nursing note and vitals reviewed. Constitutional: She is oriented to person, place, and time. She appears well-developed  and well-nourished. No distress.  HENT:  Head: Normocephalic and atraumatic.  Eyes: EOM are normal. Pupils are equal, round, and reactive to light.  GI: She exhibits no distension. There is tenderness. There is guarding. There is no rebound.  Neurological: She is alert and oriented to person, place, and time.  Skin: Skin is warm and dry. She is not diaphoretic.  Psychiatric: She has a normal mood and affect. Her behavior is normal. Judgment and thought content normal.    MAU Course  Procedures  Bedside US done and demonstrates no fetal cardiac activity. Possible abruption also noted. Certified ultrasonographer also at bedside and agrees.  Assessment and Plan  IUFD with possible placental abruption: Dr. Adrian Blackwater now at bedside and is assuming care of this pt.  Clinton Gallant. Iraida Cragin III, DrHSc, MPAS, PA-C  02/15/2012, 9:03 PM   Henrietta Hoover, PA 02/15/12 2108

## 2012-02-15 NOTE — ED Notes (Signed)
Artelia Laroche CNM called.  CNM in delivery will call back.

## 2012-02-15 NOTE — ED Provider Notes (Signed)
History     Chief Complaint  Patient presents with  . Contractions   HPI This is a 31 y.o. at [redacted]w[redacted]d who presented with contractions. Denies leaking or bleeding and reports + fetal movement. Pregnancy has been followed at [facility].   OB History    Grav Para Term Preterm Abortions TAB SAB Ect Mult Living   2 1 1             Obstetric Comments   Gestational diabetes during first pregnancy, diet-controlled      Past Medical History  Diagnosis Date  . No pertinent past medical history     Past Surgical History  Procedure Date  . Cesarean section 2012    Family History  Problem Relation Age of Onset  . Diabetes type II Mother   . Diabetes Mother   . Diabetes type II Father   . Diabetes Father     History  Substance Use Topics  . Smoking status: Never Smoker   . Smokeless tobacco: Not on file  . Alcohol Use: No    Allergies: No Known Allergies  Prescriptions prior to admission  Medication Sig Dispense Refill  . Prenatal Vit-Fe Fumarate-FA (PRENATAL MULTIVITAMIN) TABS Take 1 tablet by mouth daily.        ROS As above  Physical Exam   Blood pressure 100/69, pulse 88, temperature 98.4 F (36.9 C), temperature source Oral, resp. rate 18, height 5\' 1"  (1.549 m), weight 143 lb (64.864 kg), SpO2 96.00%, unknown if currently breastfeeding.  Physical Exam  Constitutional: She is oriented to person, place, and time. She appears well-developed and well-nourished. She appears distressed (with labor pain).  HENT:  Head: Normocephalic.  Cardiovascular: Normal rate.   Respiratory: Effort normal.  GI: Soft. She exhibits no distension and no mass. There is no tenderness. There is no rebound and no guarding.  Genitourinary: Vagina normal and uterus normal. No vaginal discharge found.       FHR reactive UCs irregular, every 2-5 min Cervix unchanged at 1/80/-2/vtx  Musculoskeletal: Normal range of motion.  Neurological: She is alert and oriented to person,  place, and time.  Skin: Skin is warm and dry.  Psychiatric: She has a normal mood and affect.  Gave pt Morphine and Phenergan with some relief.  MAU Course  Procedures   Assessment and Plan  A:  SIUP at [redacted]w[redacted]d      Prodromal contractions P:  D/C home       Labor precautions  [facility] 02/15/2012, 5:47 AM

## 2012-02-15 NOTE — Progress Notes (Signed)
Adjusting EFM, pt reports kick at this time.

## 2012-02-15 NOTE — Progress Notes (Signed)
After attempting to determine if EFM tracing is fetal or maternal, FHR is ? 120's, maternal pulse is 130's, Rice PA ask to do bedside u/s to determine.

## 2012-02-15 NOTE — Discharge Instructions (Signed)
Normal Labor and Delivery Your caregiver must first be sure you are in labor. Signs of labor include:  You may pass what is called "the mucus plug" before labor begins. This is a small amount of blood stained mucus.   Regular uterine contractions.   The time between contractions get closer together.   The discomfort and pain gradually gets more intense.   Pains are mostly located in the back.   Pains get worse when walking.   The cervix (the opening of the uterus becomes thinner (begins to efface) and opens up (dilates).  Once you are in labor and admitted into the hospital or care center, your caregiver will do the following:  A complete physical examination.   Check your vital signs (blood pressure, pulse, temperature and the fetal heart rate).   Do a vaginal examination (using a sterile glove and lubricant) to determine:   The position (presentation) of the baby (head [vertex] or buttock first).   The level (station) of the baby's head in the birth canal.   The effacement and dilatation of the cervix.   You may have your pubic hair shaved and be given an enema depending on your caregiver and the circumstance.   An electronic monitor is usually placed on your abdomen. The monitor follows the length and intensity of the contractions, as well as the baby's heart rate.   Usually, your caregiver will insert an IV in your arm with a bottle of sugar water. This is done as a precaution so that medications can be given to you quickly during labor or delivery.  NORMAL LABOR AND DELIVERY IS DIVIDED UP INTO 3 STAGES: First Stage This is when regular contractions begin and the cervix begins to efface and dilate. This stage can last from 3 to 15 hours. The end of the first stage is when the cervix is 100% effaced and 10 centimeters dilated. Pain medications may be given by   Injection (morphine, demerol, etc.)   Regional anesthesia (spinal, caudal or epidural, anesthetics given in  different locations of the spine). Paracervical pain medication may be given, which is an injection of and anesthetic on each side of the cervix.  A pregnant woman may request to have "Natural Childbirth" which is not to have any medications or anesthesia during her labor and delivery. Second Stage This is when the baby comes down through the birth canal (vagina) and is born. This can take 1 to 4 hours. As the baby's head comes down through the birth canal, you may feel like you are going to have a bowel movement. You will get the urge to bear down and push until the baby is delivered. As the baby's head is being delivered, the caregiver will decide if an episiotomy (a cut in the perineum and vagina area) is needed to prevent tearing of the tissue in this area. The episiotomy is sewn up after the delivery of the baby and placenta. Sometimes a mask with nitrous oxide is given for the mother to breath during the delivery of the baby to help if there is too much pain. The end of Stage 2 is when the baby is fully delivered. Then when the umbilical cord stops pulsating it is clamped and cut. Third Stage The third stage begins after the baby is completely delivered and ends after the placenta (afterbirth) is delivered. This usually takes 5 to 30 minutes. After the placenta is delivered, a medication is given either by intravenous or injection to help contract   the uterus and prevent bleeding. The third stage is not painful and pain medication is usually not necessary. If an episiotomy was done, it is repaired at this time. After the delivery, the mother is watched and monitored closely for 1 to 2 hours to make sure there is no postpartum bleeding (hemorrhage). If there is a lot of bleeding, medication is given to contract the uterus and stop the bleeding. Document Released: 09/05/2008 Document Revised: 11/16/2011 Document Reviewed: 09/05/2008 [employer] Patient Information 2012 [employer]. 

## 2012-02-15 NOTE — Progress Notes (Signed)
SAYS HURT BAD  ALL DAY.    VE-  1 CM-  ON   THIS AM- SENT HOME.

## 2012-02-15 NOTE — ED Notes (Signed)
Pt returned from bathroom, monitors applied. 

## 2012-02-16 ENCOUNTER — Encounter (HOSPITAL_COMMUNITY)
Admission: AD | Disposition: A | Discharge status: Home / Self Care | Payer: Self-pay | Source: Ambulatory Visit | Attending: Obstetrics & Gynecology

## 2012-02-16 ENCOUNTER — Inpatient Hospital Stay (HOSPITAL_COMMUNITY): Payer: Self-pay | Admitting: Anesthesiology

## 2012-02-16 ENCOUNTER — Inpatient Hospital Stay (HOSPITAL_COMMUNITY): Payer: Self-pay

## 2012-02-16 ENCOUNTER — Encounter (HOSPITAL_COMMUNITY): Payer: Self-pay | Admitting: Anesthesiology

## 2012-02-16 ENCOUNTER — Encounter (HOSPITAL_COMMUNITY): Payer: Self-pay | Admitting: *Deleted

## 2012-02-16 DIAGNOSIS — S3769XA Other injury of uterus, initial encounter: Secondary | ICD-10-CM | POA: Clinically undetermined

## 2012-02-16 DIAGNOSIS — O9903 Anemia complicating the puerperium: Secondary | ICD-10-CM

## 2012-02-16 DIAGNOSIS — O364XX0 Maternal care for intrauterine death, not applicable or unspecified: Secondary | ICD-10-CM

## 2012-02-16 DIAGNOSIS — IMO0002 Reserved for concepts with insufficient information to code with codable children: Secondary | ICD-10-CM | POA: Diagnosis present

## 2012-02-16 DIAGNOSIS — O34219 Maternal care for unspecified type scar from previous cesarean delivery: Secondary | ICD-10-CM

## 2012-02-16 DIAGNOSIS — O4590 Premature separation of placenta, unspecified, unspecified trimester: Secondary | ICD-10-CM

## 2012-02-16 LAB — DIC (DISSEMINATED INTRAVASCULAR COAGULATION)PANEL
D-Dimer, Quant: 14.67 ug/mL-FEU — ABNORMAL HIGH (ref 0.00–0.48)
Fibrinogen: 428 mg/dL (ref 204–475)
Platelets: 114 10*3/uL — ABNORMAL LOW (ref 150–400)
Smear Review: NONE SEEN
aPTT: 28 seconds (ref 24–37)

## 2012-02-16 LAB — CBC
HCT: 24.9 % — ABNORMAL LOW (ref 36.0–46.0)
HCT: 22.5 % — ABNORMAL LOW (ref 36.0–46.0)
Hemoglobin: 7.3 g/dL — ABNORMAL LOW (ref 12.0–15.0)
MCH: 29.3 pg (ref 26.0–34.0)
MCV: 88.9 fL (ref 78.0–100.0)
Platelets: 114 10*3/uL — ABNORMAL LOW (ref 150–400)
RBC: 2.8 MIL/uL — ABNORMAL LOW (ref 3.87–5.11)
WBC: 8.1 10*3/uL (ref 4.0–10.5)
WBC: 7.6 10*3/uL (ref 4.0–10.5)

## 2012-02-16 LAB — ABO/RH: ABO/RH(D): A POS

## 2012-02-16 LAB — RPR: RPR Ser Ql: NONREACTIVE

## 2012-02-16 LAB — CULTURE, BETA STREP (GROUP B ONLY)

## 2012-02-16 LAB — HEMOGLOBIN A1C: Mean Plasma Glucose: 120 mg/dL — ABNORMAL HIGH (ref <117)

## 2012-02-16 SURGERY — Surgical Case
Anesthesia: Epidural | Site: Abdomen | Wound class: Clean Contaminated

## 2012-02-16 MED ORDER — OXYCODONE-ACETAMINOPHEN 5-325 MG PO TABS
1.0000 | ORAL_TABLET | ORAL | Status: DC | PRN
  Administered 2012-02-17: 1 via ORAL
  Filled 2012-02-16: qty 1

## 2012-02-16 MED ORDER — ONDANSETRON HCL 4 MG/2ML IJ SOLN: 4.0000 mg | Freq: Three times a day (TID) | INTRAMUSCULAR | Status: DC | PRN

## 2012-02-16 MED ORDER — DIPHENHYDRAMINE HCL 50 MG/ML IJ SOLN: 12.5000 mg | INTRAMUSCULAR | Status: DC | PRN

## 2012-02-16 MED ORDER — PHENYLEPHRINE 40 MCG/ML (10ML) SYRINGE FOR IV PUSH (FOR BLOOD PRESSURE SUPPORT)
PREFILLED_SYRINGE | INTRAVENOUS | Status: AC
  Filled 2012-02-16: qty 5

## 2012-02-16 MED ORDER — BUPIVACAINE HCL (PF) 0.5 % IJ SOLN
INTRAMUSCULAR | Status: DC | PRN
  Administered 2012-02-16: 30 mL

## 2012-02-16 MED ORDER — LIDOCAINE-EPINEPHRINE (PF) 2 %-1:200000 IJ SOLN
INTRAMUSCULAR | Status: AC
  Filled 2012-02-16: qty 20

## 2012-02-16 MED ORDER — PHENYLEPHRINE 40 MCG/ML (10ML) SYRINGE FOR IV PUSH (FOR BLOOD PRESSURE SUPPORT)
PREFILLED_SYRINGE | INTRAVENOUS | Status: AC
  Filled 2012-02-16: qty 10

## 2012-02-16 MED ORDER — ZOLPIDEM TARTRATE 5 MG PO TABS: 5.0000 mg | ORAL_TABLET | Freq: Every evening | ORAL | Status: DC | PRN

## 2012-02-16 MED ORDER — NALOXONE HCL 0.4 MG/ML IJ SOLN: 0.4000 mg | INTRAMUSCULAR | Status: DC | PRN

## 2012-02-16 MED ORDER — OXYTOCIN 20 UNITS IN LACTATED RINGERS INFUSION - SIMPLE
INTRAVENOUS | Status: DC | PRN
  Administered 2012-02-16: 20 [IU] via INTRAVENOUS

## 2012-02-16 MED ORDER — DIPHENHYDRAMINE HCL 25 MG PO CAPS: 25.0000 mg | ORAL_CAPSULE | Freq: Four times a day (QID) | ORAL | Status: DC | PRN

## 2012-02-16 MED ORDER — KETOROLAC TROMETHAMINE 30 MG/ML IJ SOLN: 30.0000 mg | Freq: Four times a day (QID) | INTRAMUSCULAR | Status: AC | PRN

## 2012-02-16 MED ORDER — SODIUM BICARBONATE 8.4 % IV SOLN
INTRAVENOUS | Status: DC | PRN
  Administered 2012-02-16: 10 mL via EPIDURAL

## 2012-02-16 MED ORDER — ONDANSETRON HCL 4 MG/2ML IJ SOLN
INTRAMUSCULAR | Status: AC
  Filled 2012-02-16: qty 2

## 2012-02-16 MED ORDER — MENTHOL 3 MG MT LOZG: 1.0000 | LOZENGE | OROMUCOSAL | Status: DC | PRN

## 2012-02-16 MED ORDER — MAGNESIUM HYDROXIDE 400 MG/5ML PO SUSP: 30.0000 mL | ORAL | Status: DC | PRN

## 2012-02-16 MED ORDER — MEPERIDINE HCL 25 MG/ML IJ SOLN
12.5000 mg | Freq: Once | INTRAMUSCULAR | Status: AC
  Administered 2012-02-16: 12.5 mg via INTRAVENOUS

## 2012-02-16 MED ORDER — SENNOSIDES-DOCUSATE SODIUM 8.6-50 MG PO TABS
2.0000 | ORAL_TABLET | Freq: Every day | ORAL | Status: DC
  Administered 2012-02-17 – 2012-02-18 (×2): 2 via ORAL

## 2012-02-16 MED ORDER — MEPERIDINE HCL 25 MG/ML IJ SOLN: 6.2500 mg | INTRAMUSCULAR | Status: DC | PRN

## 2012-02-16 MED ORDER — SODIUM CHLORIDE 0.9 % IJ SOLN: 3.0000 mL | Freq: Two times a day (BID) | INTRAMUSCULAR | Status: DC

## 2012-02-16 MED ORDER — NALBUPHINE HCL 10 MG/ML IJ SOLN: 5.0000 mg | INTRAMUSCULAR | Status: DC | PRN

## 2012-02-16 MED ORDER — SIMETHICONE 80 MG PO CHEW
80.0000 mg | CHEWABLE_TABLET | ORAL | Status: DC | PRN
  Administered 2012-02-18 (×2): 80 mg via ORAL

## 2012-02-16 MED ORDER — DIBUCAINE 1 % RE OINT: 1.0000 "application " | TOPICAL_OINTMENT | RECTAL | Status: DC | PRN

## 2012-02-16 MED ORDER — TETANUS-DIPHTH-ACELL PERTUSSIS 5-2.5-18.5 LF-MCG/0.5 IM SUSP
0.5000 mL | Freq: Once | INTRAMUSCULAR | Status: DC
  Filled 2012-02-16: qty 0.5

## 2012-02-16 MED ORDER — METOCLOPRAMIDE HCL 5 MG/ML IJ SOLN
10.0000 mg | Freq: Three times a day (TID) | INTRAMUSCULAR | Status: DC | PRN
  Administered 2012-02-17: 10 mg via INTRAVENOUS
  Filled 2012-02-16: qty 2

## 2012-02-16 MED ORDER — LIDOCAINE HCL (PF) 1 % IJ SOLN
INTRAMUSCULAR | Status: DC | PRN
Start: 2012-02-16 — End: 2012-02-16
  Administered 2012-02-16 (×2): 4 mL

## 2012-02-16 MED ORDER — FENTANYL CITRATE 0.05 MG/ML IJ SOLN
INTRAMUSCULAR | Status: DC | PRN
  Administered 2012-02-16 (×2): 25 ug via INTRAVENOUS

## 2012-02-16 MED ORDER — CEFAZOLIN SODIUM 1-5 GM-% IV SOLN
1.0000 g | Freq: Once | INTRAVENOUS | Status: AC
  Administered 2012-02-16: 1 g via INTRAVENOUS
  Filled 2012-02-16: qty 50

## 2012-02-16 MED ORDER — ONDANSETRON HCL 4 MG/2ML IJ SOLN
INTRAMUSCULAR | Status: DC | PRN
  Administered 2012-02-16: 4 mg via INTRAVENOUS

## 2012-02-16 MED ORDER — SODIUM BICARBONATE 8.4 % IV SOLN
INTRAVENOUS | Status: AC
  Filled 2012-02-16: qty 50

## 2012-02-16 MED ORDER — FENTANYL CITRATE 0.05 MG/ML IJ SOLN
INTRAMUSCULAR | Status: AC
  Administered 2012-02-16: 25 ug via INTRAVENOUS
  Filled 2012-02-16: qty 2

## 2012-02-16 MED ORDER — SODIUM CHLORIDE 0.9 % IJ SOLN: 3.0000 mL | INTRAMUSCULAR | Status: DC | PRN

## 2012-02-16 MED ORDER — SCOPOLAMINE 1 MG/3DAYS TD PT72
1.0000 | MEDICATED_PATCH | Freq: Once | TRANSDERMAL | Status: AC
  Administered 2012-02-16: 1.5 mg via TRANSDERMAL

## 2012-02-16 MED ORDER — PHENYLEPHRINE HCL 10 MG/ML IJ SOLN
INTRAMUSCULAR | Status: DC | PRN
  Administered 2012-02-16: 40 ug via INTRAVENOUS
  Administered 2012-02-16: 80 ug via INTRAVENOUS
  Administered 2012-02-16: 40 ug via INTRAVENOUS
  Administered 2012-02-16 (×2): 80 ug via INTRAVENOUS
  Administered 2012-02-16: 40 ug via INTRAVENOUS
  Administered 2012-02-16 (×3): 80 ug via INTRAVENOUS

## 2012-02-16 MED ORDER — SODIUM CHLORIDE 0.9 % IV SOLN: 250.0000 mL | INTRAVENOUS | Status: DC

## 2012-02-16 MED ORDER — PRENATAL MULTIVITAMIN CH
1.0000 | ORAL_TABLET | Freq: Every day | ORAL | Status: DC
  Administered 2012-02-18 – 2012-02-19 (×2): 1 via ORAL
  Filled 2012-02-16 (×2): qty 1

## 2012-02-16 MED ORDER — IBUPROFEN 600 MG PO TABS
600.0000 mg | ORAL_TABLET | Freq: Four times a day (QID) | ORAL | Status: DC
  Administered 2012-02-17 – 2012-02-19 (×8): 600 mg via ORAL
  Filled 2012-02-16 (×10): qty 1

## 2012-02-16 MED ORDER — OXYTOCIN 10 UNIT/ML IJ SOLN
INTRAMUSCULAR | Status: AC
  Filled 2012-02-16: qty 2

## 2012-02-16 MED ORDER — OXYTOCIN 20 UNITS IN LACTATED RINGERS INFUSION - SIMPLE
INTRAVENOUS | Status: AC
  Filled 2012-02-16: qty 1000

## 2012-02-16 MED ORDER — FENTANYL CITRATE 0.05 MG/ML IJ SOLN
25.0000 ug | INTRAMUSCULAR | Status: DC | PRN
  Administered 2012-02-16 (×2): 25 ug via INTRAVENOUS

## 2012-02-16 MED ORDER — DIPHENHYDRAMINE HCL 50 MG/ML IJ SOLN: 25.0000 mg | INTRAMUSCULAR | Status: DC | PRN

## 2012-02-16 MED ORDER — LACTATED RINGERS IV SOLN
INTRAVENOUS | Status: DC | PRN
  Administered 2012-02-16: 13:00:00 via INTRAVENOUS

## 2012-02-16 MED ORDER — IBUPROFEN 600 MG PO TABS: 600.0000 mg | ORAL_TABLET | Freq: Four times a day (QID) | ORAL | Status: DC | PRN

## 2012-02-16 MED ORDER — KETOROLAC TROMETHAMINE 60 MG/2ML IM SOLN: 60.0000 mg | Freq: Once | INTRAMUSCULAR | Status: AC | PRN

## 2012-02-16 MED ORDER — FENTANYL 2.5 MCG/ML BUPIVACAINE 1/10 % EPIDURAL INFUSION (WH - ANES)
INTRAMUSCULAR | Status: DC | PRN
  Administered 2012-02-16: 13 mL/h via EPIDURAL

## 2012-02-16 MED ORDER — SODIUM CHLORIDE 0.9 % IV SOLN: 1.0000 ug/kg/h | INTRAVENOUS | Status: DC | PRN

## 2012-02-16 MED ORDER — MORPHINE SULFATE 0.5 MG/ML IJ SOLN
INTRAMUSCULAR | Status: AC
  Filled 2012-02-16: qty 10

## 2012-02-16 MED ORDER — SCOPOLAMINE 1 MG/3DAYS TD PT72
MEDICATED_PATCH | TRANSDERMAL | Status: AC
  Filled 2012-02-16: qty 1

## 2012-02-16 MED ORDER — MORPHINE SULFATE (PF) 0.5 MG/ML IJ SOLN
INTRAMUSCULAR | Status: DC | PRN
  Administered 2012-02-16: 3 mg via EPIDURAL

## 2012-02-16 MED ORDER — SODIUM CHLORIDE 0.9 % IV SOLN
INTRAVENOUS | Status: DC
  Administered 2012-02-16: 20 mL/h via INTRAVENOUS

## 2012-02-16 MED ORDER — DIPHENHYDRAMINE HCL 25 MG PO CAPS: 25.0000 mg | ORAL_CAPSULE | ORAL | Status: DC | PRN

## 2012-02-16 MED ORDER — LANOLIN HYDROUS EX OINT: 1.0000 "application " | TOPICAL_OINTMENT | CUTANEOUS | Status: DC | PRN

## 2012-02-16 MED ORDER — OXYTOCIN 20 UNITS IN LACTATED RINGERS INFUSION - SIMPLE: 125.0000 mL/h | INTRAVENOUS | Status: DC

## 2012-02-16 MED ORDER — MEPERIDINE HCL 25 MG/ML IJ SOLN
INTRAMUSCULAR | Status: AC
  Administered 2012-02-16: 12.5 mg via INTRAVENOUS
  Filled 2012-02-16: qty 1

## 2012-02-16 MED ORDER — OXYTOCIN 20 UNITS IN LACTATED RINGERS INFUSION - SIMPLE
1.0000 m[IU]/min | INTRAVENOUS | Status: DC
  Administered 2012-02-16: 14 m[IU]/min via INTRAVENOUS
  Administered 2012-02-16: 2 m[IU]/min via INTRAVENOUS
  Administered 2012-02-16: 16 m[IU]/min via INTRAVENOUS
  Administered 2012-02-16: 20 m[IU]/min via INTRAVENOUS

## 2012-02-16 MED ORDER — ONDANSETRON HCL 4 MG/2ML IJ SOLN
4.0000 mg | INTRAMUSCULAR | Status: DC | PRN
  Administered 2012-02-17: 4 mg via INTRAVENOUS
  Filled 2012-02-16: qty 2

## 2012-02-16 MED ORDER — WITCH HAZEL-GLYCERIN EX PADS: 1.0000 "application " | MEDICATED_PAD | CUTANEOUS | Status: DC | PRN

## 2012-02-16 MED ORDER — ONDANSETRON HCL 4 MG PO TABS: 4.0000 mg | ORAL_TABLET | ORAL | Status: DC | PRN

## 2012-02-16 MED ORDER — BUPIVACAINE HCL (PF) 0.5 % IJ SOLN
INTRAMUSCULAR | Status: AC
  Filled 2012-02-16: qty 30

## 2012-02-16 MED ORDER — FENTANYL CITRATE 0.05 MG/ML IJ SOLN
INTRAMUSCULAR | Status: AC
  Filled 2012-02-16: qty 2

## 2012-02-16 SURGICAL SUPPLY — 37 items
CHLORAPREP W/TINT 26ML (MISCELLANEOUS) ×2 IMPLANT
CLOTH BEACON ORANGE TIMEOUT ST (SAFETY) ×2 IMPLANT
DRESSING TELFA 8X3 (GAUZE/BANDAGES/DRESSINGS) ×4 IMPLANT
DRSG PAD ABDOMINAL 8X10 ST (GAUZE/BANDAGES/DRESSINGS) ×2 IMPLANT
ELECT REM PT RETURN 9FT ADLT (ELECTROSURGICAL) ×2
ELECTRODE REM PT RTRN 9FT ADLT (ELECTROSURGICAL) ×1 IMPLANT
EXTRACTOR VACUUM M CUP 4 TUBE (SUCTIONS) IMPLANT
GAUZE SPONGE 4X4 12PLY STRL LF (GAUZE/BANDAGES/DRESSINGS) ×4 IMPLANT
GLOVE BIO SURGEON STRL SZ7 (GLOVE) ×2 IMPLANT
GLOVE BIOGEL PI IND STRL 7.0 (GLOVE) ×2 IMPLANT
GLOVE BIOGEL PI INDICATOR 7.0 (GLOVE) ×2
GOWN PREVENTION PLUS LG XLONG (DISPOSABLE) ×4 IMPLANT
HEMOSTAT SURGICEL 2X14 (HEMOSTASIS) ×2 IMPLANT
KIT ABG SYR 3ML LUER SLIP (SYRINGE) IMPLANT
NEEDLE HYPO 22GX1.5 SAFETY (NEEDLE) ×2 IMPLANT
NEEDLE HYPO 25X5/8 SAFETYGLIDE (NEEDLE) ×2 IMPLANT
NS IRRIG 1000ML POUR BTL (IV SOLUTION) ×2 IMPLANT
PACK C SECTION WH (CUSTOM PROCEDURE TRAY) ×2 IMPLANT
PAD ABD 7.5X8 STRL (GAUZE/BANDAGES/DRESSINGS) IMPLANT
RTRCTR C-SECT PINK 25CM LRG (MISCELLANEOUS) IMPLANT
SLEEVE SCD COMPRESS KNEE LRG (MISCELLANEOUS) ×2 IMPLANT
SLEEVE SCD COMPRESS KNEE MED (MISCELLANEOUS) IMPLANT
SPONGE GAUZE 4X4 12PLY (GAUZE/BANDAGES/DRESSINGS) ×2 IMPLANT
STAPLER VISISTAT 35W (STAPLE) ×2 IMPLANT
SUT MNCRL 0 VIOLET CTX 36 (SUTURE) ×2 IMPLANT
SUT MONOCRYL 0 CTX 36 (SUTURE) ×2
SUT PDS AB 0 CT1 27 (SUTURE) IMPLANT
SUT PDS AB 0 CTX 36 PDP370T (SUTURE) ×2 IMPLANT
SUT VIC AB 0 CT1 36 (SUTURE) ×4 IMPLANT
SUT VIC AB 2-0 CT1 27 (SUTURE)
SUT VIC AB 2-0 CT1 TAPERPNT 27 (SUTURE) IMPLANT
SUT VIC AB 4-0 KS 27 (SUTURE) IMPLANT
SYR CONTROL 10ML LL (SYRINGE) ×2 IMPLANT
TAPE CLOTH SURG 4X10 WHT LF (GAUZE/BANDAGES/DRESSINGS) ×2 IMPLANT
TOWEL OR 17X24 6PK STRL BLUE (TOWEL DISPOSABLE) ×4 IMPLANT
TRAY FOLEY CATH 14FR (SET/KITS/TRAYS/PACK) IMPLANT
WATER STERILE IRR 1000ML POUR (IV SOLUTION) IMPLANT

## 2012-02-16 NOTE — Progress Notes (Signed)
Subjective: Has epidural and is fairly comfortable.   Objective: BP 91/58  Pulse 122  Temp(Src) 98 F (36.7 C) (Oral)  Resp 26  Ht 5\' 1"  (1.549 m)  Wt 63.504 kg (140 lb)  BMI 26.45 kg/m2  SpO2 96%  Breastfeeding? Unknown      SVE:   Dilation: 1 Effacement (%): Thick (long) Exam by:: Janequa Kipnis,md  Labs: Lab Results  Component Value Date   WBC 16.1* 02/15/2012   HGB 11.2* 02/15/2012   HCT 33.8* 02/15/2012   MCV 87.8 02/15/2012   PLT 156 02/15/2012    Assessment / Plan: Foley balloon placed.  Will start low dose pitocin.    Brenda Solomon JEHIEL 02/16/2012, 1:03 AM

## 2012-02-16 NOTE — Op Note (Signed)
Brenda Solomon PROCEDURE DATE: 02/15/2012 - 02/16/2012  PREOPERATIVE DIAGNOSIS: Intrauterine pregnancy at  [redacted]w[redacted]d weeks gestation; known intrauterine fetal demise; previous cesarean section; ruptured uterus  POSTOPERATIVE DIAGNOSIS: The same  PROCEDURE: Repeat Low Transverse Cesarean Section  SURGEON:  Dr. Jaynie Collins  ANESTHESIOLOGIST: Jiles Garter, MD  INDICATIONS: Brenda Solomon is a 31 y.o. G2P2001 at [redacted]w[redacted]d taken for urgent repeat cesarean section secondary to ruptured uterus. She was admitted on 02/15/12 with diagnosis of IUFD and underwent induction of labor with foley bulb and pitocin per protocol.  On 02/16/12, patient was noted to have minimal response to pitocin and was also noted be tachycardic.  She also had uterine tenderness on exam.  Given these concerning symptoms, a bedside ultrasound was ordered which revealed a ruptured uterus.  Patient was counseled regarding a cesarean section, possible hysterectomy.  The risks of cesarean section were discussed with the patient including but were not limited to: bleeding which may require transfusion or reoperation; infection which may require antibiotics; injury to bowel, bladder, ureters or other surrounding organs; injury to the fetus; need for additional procedures including hysterectomy in the event of a life-threatening hemorrhage; placental abnormalities wth subsequent pregnancies, incisional problems, thromboembolic phenomenon and other postoperative/anesthesia complications.   The patient concurred with the proposed plan, giving informed written consent for the procedure. She was crossmatched for four units of blood; stat CBC and DIC panel ordered en route to the OR.    FINDINGS:  About 2 liters of hemoperitoneum was noted upon entry into the abdomen.  The deceased fetus and placenta was noted to be in the abdomen outside the uterus. The uterus had a rupture in the lower uterine segment corresponding to her previous scar. No other uterine  anomalies; normal fallopian tubes and ovaries bilaterally.  ANESTHESIA:  Epidural INTRAVENOUS FLUIDS: 2300 ml ESTIMATED BLOOD LOSS: 2000 ml URINE OUTPUT:  200 ml SPECIMENS: Placenta sent to pathology COMPLICATIONS: None immediate  PROCEDURE IN DETAIL:  The patient received intravenous antibiotics and had sequential compression devices applied to her lower extremities while in the preoperative area.  She was then taken to the operating room where the epidural anesthesia was dosed up to surgical level and was found to be adequate. She was then placed in a dorsal supine position, and prepped and draped in a sterile manner.  A foley catheter was placed into her bladder and attached to constant gravity.  After an adequate timeout was performed, a Pfannenstiel skin incision was made with scalpel over her preexisting scar, and carried through to the underlying layer of fascia. The fascia was incised in the midline, and this incision was extended bilaterally using the Mayo scissors.  Kocher clamps were applied to the superior aspect of the fascial incision and the underlying rectus muscles were dissected off sharply. A similar process was carried out on the inferior aspect of the facial incision. The rectus muscles were separated in the midline bluntly and the peritoneum was entered bluntly. There was a lot of hemoperitoneum noted that was suctioned off.  The deceased fetus and placenta were noted and were delivered out of the abdomen. Attention was turned to he luterus where a low transverse hysterotomy was noted corresponding to uterine rupture from previous uterine scar.   The uterus was then cleared of clot and debris.  The hysterotomy/uterine rupture was closed with 0 Monocryl in a running locked fashion, and an imbricating layer was also placed with a 0 Monocryl suture. Surgicel was placed over the repaired uterine rupture  site. The pelvis was cleared of most of the blood, clot and debris and copiously  irrigated with warm normal saline.  Hemostasis was confirmed on all surfaces.  The peritoneum and the muscles were reapproximated using 0 Monocryl interrupted stitches. The fascia was then closed using 0 PDS in a running fashion.  The skin was closed with staples. The patient tolerated the procedure well. Sponge, lap, instrument and needle counts were correct x 2.  She was taken to the recovery room in stable condition.   Patient will be monitored closely given her amount of blood loss.  Her hemoglobin returned at 8.2 during the case; admission Hgb was 11.2.  Labs will be checked in the PACU; transfusion will be given if needed.

## 2012-02-16 NOTE — Progress Notes (Signed)
Bedside ultransonographer reported to RN, Mayford Knife, CNM and Dr Macon Large that patient has probable uterine rupture noted on ultrasound.

## 2012-02-16 NOTE — Progress Notes (Signed)
Patient ID: Brenda Solomon, female   DOB: [birth year], 31 y.o.   MRN: [id number]  Called to see pt. Husband was asking about plan of care and how long it would take. I explained the POC and about the [place] I ordered to try to look at the previous C/S scar and placenta. I explained I still may not get full answers about the placenta and whether or not it was an abruption. She wanted to know if I would get answers for sure about what happened, and I explained will not get those answers from the [place].  They don't want to have a C/S and I told them there was a good probability we will be able to achieve a vaginal delivery.   UCs now every 5-6 minutes. Cervix is tight 2cm, balloon intact

## 2012-02-16 NOTE — Progress Notes (Signed)
Visited with pt during early stages of labor to offer support. Voncille was quiet but not tearful at the moment.  Pt was accompanied by husband and friend.  They have a 71 month old daughter at home.  Smriti and her family are Muslim.  I asked if there were particular rituals that they would want once the baby was delivered and/or if there was anyone in their community that they would want me to call.  They said that they had already been in touch with someone and that they would do their own rituals.  Family did not wish for me to come back and check on how they were doing, but preferred to call if they would like any further chaplaincy support.    Please page only as family requests, [phone].  Chaplain Orpha Bur Travis Purk 11:05 AM   02/16/12 1000  Clinical Encounter Type  Visited With Patient and family together  Visit Type Initial  Referral From Nurse  Spiritual Encounters  Spiritual Needs Grief support  Stress Factors  Patient Stress Factors Loss

## 2012-02-16 NOTE — Anesthesia Procedure Notes (Signed)
Epidural Patient location during procedure: OB Start time: 02/16/2012 12:11 AM  Staffing Anesthesiologist: Daleysa Kristiansen A. Performed by: anesthesiologist   Preanesthetic Checklist Completed: patient identified, site marked, surgical consent, pre-op evaluation, timeout performed, IV checked, risks and benefits discussed and monitors and equipment checked  Epidural Patient position: sitting Prep: site prepped and draped and DuraPrep Patient monitoring: continuous pulse ox and blood pressure Approach: midline Injection technique: LOR air  Needle:  Needle type: Tuohy  Needle gauge: 17 G Needle length: 9 cm Needle insertion depth: 4 cm Catheter type: closed end flexible Catheter size: 19 Gauge Catheter at skin depth: 10 cm Test dose: negative and Other  Assessment Events: blood not aspirated, injection not painful, no injection resistance, negative IV test and no paresthesia  Additional Notes Patient identified. Risks and benefits discussed including failed block, incomplete  Pain control, post dural puncture headache, nerve damage, paralysis, blood pressure Changes, nausea, vomiting, reactions to medications-both toxic and allergic and post Partum back pain. All questions were answered. Patient expressed understanding and wished to proceed. Sterile technique was used throughout procedure. Epidural site was Dressed with sterile barrier dressing. No paresthesias, signs of intravascular injection Or signs of intrathecal spread were encountered.  Patient was more comfortable after the epidural was dosed. Please see RN's note for documentation of vital signs and FHR which are stable.

## 2012-02-16 NOTE — Progress Notes (Signed)
Tracia Lacomb is a 31 y.o. G2P1001 at [redacted]w[redacted]d admitted for fetal demise.   Subjective: Patient is more comfortable after receiving epidural.   Objective: BP 107/73  Pulse 126  Temp(Src) 98 F (36.7 C) (Oral)  Resp 38  Ht 5\' 1"  (1.549 m)  Wt 140 lb (63.504 kg)  BMI 26.45 kg/m2  SpO2 100%  Breastfeeding? Unknown      Labs: Lab Results  Component Value Date   WBC 16.1* 02/15/2012   HGB 11.2* 02/15/2012   HCT 33.8* 02/15/2012   MCV 87.8 02/15/2012   PLT 156 02/15/2012    Assessment / Plan: I have followed the patient through her prenatal care. I was notified by Dr. Adrian Blackwater that the patient has been admitted for fetal demise this evening.   Labor: induction of labor for fetal demise. Starting pitocin now. Cervical exam: closed/long/very high (cannot feel fetal head) Pain Control:  Epidural I/D:  n/a Anticipated MOD:  vaginal delivery. Patient has a history of previous C-section, however, would like to try to deliver vaginally and avoid C-section at this time since she would like to deliver vaginally for subsequent pregnancies.    OH PARK, Marylene Land, MD 02/16/2012, 12:32 AM Family Medicine resident [facility]

## 2012-02-16 NOTE — Progress Notes (Signed)
Attending Preoperative Note  Preliminary read of ultrasound shows a uterine rupture and known IUFD, history of prior cesarean section scar.  Patient is tachycardic and had abdominal tenderness. Explained diagnosis to patient and her family, and emphasized need for cesarean section with increased risk of needing hysterectomy.  The risks of cesarean section discussed with the patient included but were not limited to: bleeding which may require transfusion or reoperation; infection which may require antibiotics; injury to bowel, bladder, ureters or other surrounding organs; need for additional procedures including hysterectomy in the event of a life-threatening hemorrhage; placental abnormalities wth subsequent pregnancies, incisional problems, thromboembolic phenomenon and other postoperative/anesthesia complications. The patient concurred with the proposed plan, giving informed written consent for the procedure.    Anesthesia and OR aware.  Preoperative prophylactic Ancef ordered on call to the OR.  To OR when ready.  Jaynie Collins, M.D. 02/16/2012 12:22 PM

## 2012-02-16 NOTE — Transfer of Care (Signed)
Immediate Anesthesia Transfer of Care Note  Patient: Brenda Solomon  Procedure(s) Performed: Procedure(s) (LRB): CESAREAN SECTION (N/A)  Patient Location: PACU  Anesthesia Type: Epidural  Level of Consciousness: awake, alert  and oriented  Airway & Oxygen Therapy: Patient Spontanous Breathing and Patient connected to nasal cannula oxygen  Post-op Assessment: Report given to PACU RN and Post -op Vital signs reviewed and stable  Post vital signs: Reviewed and stable  Complications: No apparent anesthesia complications

## 2012-02-16 NOTE — Anesthesia Postprocedure Evaluation (Signed)
  Anesthesia Post-op Note  Patient: Brenda Solomon  Procedure(s) Performed: Procedure(s) (LRB): CESAREAN SECTION (N/A)  Patient is awake, responsive, moving her legs, and has signs of resolution of her numbness. Pain and nausea are reasonably well controlled. Vital signs are stable and clinically acceptable. Oxygen saturation is clinically acceptable. There are no apparent anesthetic complications at this time. Patient is ready for discharge.

## 2012-02-16 NOTE — Progress Notes (Signed)
Patient ID: Brenda Solomon, female   DOB: 03/04/1981, 31 y.o.   MRN: [id number]  Comfortable with epidural  Crying, grieving.  UCs not tracing well, toco readjusted  Cervix deferred. Foley in place.

## 2012-02-17 LAB — CBC
Hemoglobin: 13.2 g/dL (ref 12.0–15.0)
MCH: 29.8 pg (ref 26.0–34.0)
MCHC: 33.8 g/dL (ref 30.0–36.0)

## 2012-02-17 NOTE — Anesthesia Postprocedure Evaluation (Signed)
  Anesthesia Post-op Note  Patient: Brenda Solomon  Procedure(s) Performed: Procedure(s) (LRB): CESAREAN SECTION (N/A)  Patient Location: [facility]  Anesthesia Type: Epidural  Level of Consciousness: awake, alert  and oriented  Airway and Oxygen Therapy: Patient Spontanous Breathing   Post-op Assessment: Patient's Cardiovascular Status Stable and Respiratory Function Stable  Post-op Vital Signs: stable  Complications: No apparent anesthesia complications

## 2012-02-17 NOTE — Addendum Note (Signed)
Addendum  created 02/17/12 1478 by Len Blalock, CRNA   Modules edited:Notes Section

## 2012-02-17 NOTE — Progress Notes (Signed)
Dr. Macon Large notified of CBC results. No new orders at this time. Will continue to monitor.

## 2012-02-17 NOTE — Progress Notes (Signed)
Subjective: Postpartum Day 1: Cesarean Delivery for abruption, with IUFD and Uterine Rupture (prior Cesarean) Patient reports nausea., and mild abd discomfort. She desires foley removal to allow her to try to void. Husband at bedside, supportive.  Objective: Vital signs in last 24 hours: Temp:  [97.8 F (36.6 C)-100.1 F (37.8 C)] 98 F (36.7 C) (03/09 0615) Pulse Rate:  [80-150] 80  (03/09 0615) Resp:  [18-30] 18  (03/09 0615) BP: (81-167)/(35-149) 95/66 mmHg (03/09 0615) SpO2:  [88 %-100 %] 95 % (03/09 0615)  Physical Exam:  General: fatigued, no distress and resting comfortably when at bedrest Lochia: appropriate Uterine Fundus: below umbilicus.  Abd mild tenderness., bowel sounds hypoactive frequency, normal pitch. No rebound tenderness Incision: dressing dry, no drainage, intact DVT Evaluation: No evidence of DVT seen on physical exam.   Basename 02/16/12 1322 02/16/12 1215  HGB 7.3* 8.2*  HCT 22.5* 24.9*    Assessment/Plan: Status post Cesarean section. Postoperative course complicated by anemia due to abruption, now s/p transfusion  units.x 4 Clear liquids/ discontinue foley/ check cbc at 10 am .  Tilda Burrow 02/17/2012, 7:33 AM

## 2012-02-18 LAB — CBC
MCHC: 33.2 g/dL (ref 30.0–36.0)
MCV: 88.9 fL (ref 78.0–100.0)
Platelets: 105 10*3/uL — ABNORMAL LOW (ref 150–400)
RDW: 14.8 % (ref 11.5–15.5)
WBC: 9.3 10*3/uL (ref 4.0–10.5)

## 2012-02-18 LAB — TYPE AND SCREEN
ABO/RH(D): A POS
Unit division: 0
Unit division: 0

## 2012-02-18 NOTE — Progress Notes (Signed)
Subjective: Postpartum Day 2: Cesarean Delivery for IUFD/Abruption Patient reports incisional pain, tolerating PO, + flatus and no problems voiding.  The patient and her husband had many questions regarding the findings at cesarean section. She and her husband have asked if the baby or the placenta were still attached to the uterus and I have explained that this was not the case. Additional questions include questions of exactly when the uterus ruptured which I could not answer exactly. I was able to explained to them that at the time of her discharge earlier, that no suggestion of difficulty was identifiable on the external monitor. They asked why there was never any bleeding externally per vagina, at any time,, as well as asking how long that she wait before attempting future pregnancy,, and how and how safe it would be for her to have future pregnancies. All questions answered to patient's apparent acceptance. She would like a chance to ask further questions tomorrow. Patient would like to stay an additional 24 hours to recover which is certainly encouraged . Patient and husband do not wish to see a minister of their faith at this time.  Objective: Vital signs in last 24 hours: Temp:  [97.4 F (36.3 C)-98.6 F (37 C)] 97.4 F (36.3 C) (03/10 0559) Pulse Rate:  [72-88] 72  (03/10 0559) Resp:  [18-20] 18  (03/10 0559) BP: (90-111)/(59-76) 101/69 mmHg (03/10 0559) SpO2:  [93 %-96 %] 93 % (03/10 0559)  Physical Exam:  General: alert, cooperative and no distress Lochia: minimal Uterine Fundus: firm  Below umbilicus Incision: healing well, staples intact no bruising DVT Evaluation: No evidence of DVT seen on physical exam.   Basename 02/18/12 0519 02/17/12 0949  HGB 11.7* 13.2  HCT 35.2* 39.1    Assessment/Plan: Status post Cesarean section. Doing well postoperatively. Appropriate grief and questions after abruption and fetal loss  Continue inpatient x24 hours we'll check for further  questions in a.m.Marland Kitchen  Ajay Strubel V 02/18/2012, 10:36 AM

## 2012-02-19 ENCOUNTER — Encounter (HOSPITAL_COMMUNITY): Payer: Self-pay | Admitting: Obstetrics & Gynecology

## 2012-02-19 ENCOUNTER — Other Ambulatory Visit: Payer: Self-pay

## 2012-02-19 LAB — CBC
Hemoglobin: 11.3 g/dL — ABNORMAL LOW (ref 12.0–15.0)
MCHC: 32.6 g/dL (ref 30.0–36.0)
Platelets: 116 10*3/uL — ABNORMAL LOW (ref 150–400)
RDW: 15.1 % (ref 11.5–15.5)

## 2012-02-19 LAB — BASIC METABOLIC PANEL
GFR calc Af Amer: >90 mL/min (ref >90)
GFR calc non Af Amer: >90 mL/min (ref >90)
Glucose, Bld: 102 mg/dL — ABNORMAL HIGH (ref 70–99)
Potassium: 3.6 mEq/L (ref 3.5–5.1)
Sodium: 138 mEq/L (ref 135–145)

## 2012-02-19 MED ORDER — GI COCKTAIL ~~LOC~~
30.0000 mL | Freq: Once | ORAL | Status: AC
  Administered 2012-02-19: 30 mL via ORAL
  Filled 2012-02-19: qty 30

## 2012-02-19 MED ORDER — POLYETHYLENE GLYCOL 3350 17 GM/SCOOP PO POWD: 17.0000 g | Freq: Every day | ORAL | Status: AC | PRN

## 2012-02-19 MED ORDER — IBUPROFEN 600 MG PO TABS: 600.0000 mg | ORAL_TABLET | Freq: Four times a day (QID) | ORAL | Status: AC

## 2012-02-19 MED ORDER — PANTOPRAZOLE SODIUM 40 MG PO TBEC
40.0000 mg | DELAYED_RELEASE_TABLET | Freq: Every day | ORAL | Status: DC
  Administered 2012-02-19: 40 mg via ORAL
  Filled 2012-02-19 (×2): qty 1

## 2012-02-19 NOTE — Progress Notes (Signed)
Subjective: Postpartum Day 3: Cesarean Delivery Patient reports tolerating PO, + flatus and + BM. The abd pain responded to GI cocktail, pain reduced to a 2, and then some recurrence.  Since BM  Pt feels better . Pt still not out of bed as much as desired   Objective: Vital signs in last 24 hours: Temp:  [96.4 F (35.8 C)-98.1 F (36.7 C)] 97.6 F (36.4 C) (03/11 0623) Pulse Rate:  [48-80] 67  (03/11 0623) Resp:  [16-18] 18  (03/11 0623) BP: (106-137)/(72-85) 134/80 mmHg (03/11 0623) SpO2:  [95 %-98 %] 98 % (03/11 9147)  Physical Exam:  General: alert and anxious Lochia: appropriate Uterine Fundus: deep in pelvis, difficult to feel, nontender Incision: healing well, no significant drainage, no dehiscence, staples to be d/c this a.m. DVT Evaluation: No evidence of DVT seen on physical exam.   Basename 02/19/12 0400 02/18/12 0519  HGB 11.3* 11.7*  HCT 34.7* 35.2*  wbc normal  Assessment/Plan: Status post Cesarean section. Doing well postoperatively. postop slow recovery , as expected, with GERD.   Get EKG as precaution, and patient assurance Shower, increase ambulation, probable d/c this afternoon.Marland Kitchen  Presley Gora V 02/19/2012, 7:02 AM

## 2012-02-19 NOTE — Discharge Planning (Signed)
Obstetric Discharge Summary Reason for Admission: IUFD Prenatal Procedures: NST and ultrasound Intrapartum Procedures: cesarean Postpartum Procedures: transfusion 4U PRBC Complications-Operative and Postpartum: none Hemoglobin  Date Value Range Status  02/19/2012 11.3* 12.0-15.0 (g/dL) Final     HCT  Date Value Range Status  02/19/2012 34.7* 36.0-46.0 (%) Final    Discharge Diagnoses: IUFD  Discharge Information: Date: 02/19/2012 Activity: pelvic rest Diet: routine Medications: PNV, Ibuprofen and Colace Condition: stable Instructions: refer to practice specific booklet Discharge to: home Follow-up Information    Follow up with [facility], Marylene Land, MD on 03/20/2012. (Post-partum visit with Dr. Madolyn Frieze @ 10:00 am)    Contact information:   [address] [place] [address] [phone]         Brief Hospital Course: This is a 31 year old [place] G2 who was admitted for intrauterine fetal demise at 36.7 weeks. She had presented to the MAU earlier the day of admission due to abdominal pain. She was monitored for 4-hours and was discharged to home due to fetus appearing well. She returned several hours later and was found to have an IUFD. The patient has a history of prior C-section due to breech presentation and due to desire to have subsequent vaginal deliveries, a trial of induction was attempted with pitocin and Foley bulb. However, due to failure to progress and persistent abdominal pain, an ultrasound was acquired, which showed uterine rupture. The patient when taken to the OR for an emergent C-section that showed about 2 L of clotted blood in the peritoneum. Post-partum, the patient received 4U PRBC due to her hemoglobin dropping from 11.2 to 7.3. The patient did well physically post-operatively, grieving appropriately. The patient was discharged to home on POD#3. She will follow-up with her PCP in a few days for staple removal and then in 4-weeks for a post-partum  follow-up.    OH PARK, ANGELA 02/19/2012, 10:00 AM

## 2012-02-19 NOTE — Progress Notes (Signed)
I first saw this family during early stages of labor.  I came to check in on them and see how they were coping.  They were quiet and reserved.  We spoke about resources for support in the community, particularly [employer].  We also spoke about grief and what they might expect from their 64 month old daughter who will likely sense that they are sad but be unable to really understand what is going on.  I offered grief support and education.  Kathleen Argue [phone] 10:34 AM   02/19/12 1000  Clinical Encounter Type  Visited With Patient;Patient and family together  Visit Type Follow-up  Spiritual Encounters  Spiritual Needs Grief support  Stress Factors  Patient Stress Factors Loss

## 2012-02-19 NOTE — Progress Notes (Signed)
D/C instructions reviewed with pt. And husband.  Both verbally state understanding of same.  No home equipment needed.  Ambulated to car with staff without incident.  D/C'd home with family.

## 2012-02-19 NOTE — Progress Notes (Signed)
Utilization review of chart completed.

## 2012-02-19 NOTE — Progress Notes (Signed)
3 Days Post-Op Procedure(s) (LRB): CESAREAN SECTION (N/A)for abruption, Asked to see pt due to substernal discomfort , worsened with deep inspiration, noted thru to midline, not to either side.   Subjective: Patient reports + flatus no bm, still little appetite.  Objective: I have reviewed patient's vital signs, intake and output, medications and pulse oximetry, 97% on RA, with pulse stable in 50-60 range. unchanged.  General: alert and anxious Resp: clear to auscultation bilaterally Cardio: regular rate and rhythm, S1, S2 normal, no murmur, click, rub or gallop GI: soft, non-tender; bowel sounds normal; no masses,  no organomegaly and incision: clean, dry and intact  Assessment:  Probable Reflux sx  s/p Procedure(s) (LRB): CESAREAN SECTION (N/A): postop reflux  Plan: CBC, Bmet , gi cocktail. Reassess in 2 hrs.  LOS: 4 days    Kieran Arreguin V 02/19/2012, 3:56 AM

## 2012-02-23 ENCOUNTER — Encounter: Payer: Self-pay | Admitting: Family Medicine

## 2012-02-23 ENCOUNTER — Ambulatory Visit (INDEPENDENT_AMBULATORY_CARE_PROVIDER_SITE_OTHER): Payer: Self-pay | Admitting: Family Medicine

## 2012-02-23 DIAGNOSIS — Z4889 Encounter for other specified surgical aftercare: Secondary | ICD-10-CM

## 2012-02-23 NOTE — Progress Notes (Signed)
  Subjective:    Patient ID: Brenda Solomon, female    DOB: 24-Aug-1981, 31 y.o.   MRN: [id number]  HPI Patient here to have staples from C-section removed. Patient report mood is "okay".  Review of Systems     Objective:   Physical Exam Gen: NAD, accompanied by toddler daughter and husband Psych: not tearful today but appears appropriately sad Abd: soft, mild TTP, incision is C/D/I Ext: non-pitting pedal edema  Removed staples from transverse lower uterine C-section Cleaned area with alcohol swab Removed staple with staple remover No bleeding, scar healing well, no need for bandage or steri-strips Re-cleaned area after staples removed    Assessment & Plan:

## 2012-02-23 NOTE — Assessment & Plan Note (Signed)
Removed sutures today. Advised to keep clean and dry.

## 2012-02-23 NOTE — Patient Instructions (Signed)
Try vitamin E oil or cocoa butter on your stretch marks.  You have an appointment at the [facility] clinic at [facility] on 02/28.  You have an appointment with Dr. Madolyn Frieze on 03/10.  I will continue to pray for you Brenda Solomon and your family.

## 2012-02-23 NOTE — Discharge Summary (Signed)
  Adam Phenix, MD Physician Signed Family Medicine Discharge Planning 02/19/2012 10:00 AM   Obstetric Discharge Summary  Reason for Admission: IUFD  Prenatal Procedures: NST and ultrasound  Intrapartum Procedures: cesarean  Postpartum Procedures: transfusion 4U PRBC  Complications-Operative and Postpartum: none    Hemoglobin    Date  Value  Range  Status    02/19/2012  11.3*  12.0-15.0 (g/dL)  Final       HCT    Date  Value  Range  Status    02/19/2012  34.7*  36.0-46.0 (%)  Final     Discharge Diagnoses: IUFD  Discharge Information:  Date: 02/19/2012  Activity: pelvic rest  Diet: routine  Medications: PNV, Ibuprofen and Colace  Condition: stable  Instructions: refer to practice specific booklet  Discharge to: home    Follow-up Information     Follow up with [facility], Marylene Land, MD on 03/20/2012. (Post-partum visit with Dr. Madolyn Frieze @ 10:00 am)     Contact information:     [address]  [place] [address]  [phone]            Brief Hospital Course: This is a 31 year old [place] G2 who was admitted for intrauterine fetal demise at 36.7 weeks. She had presented to the MAU earlier the day of admission due to abdominal pain. She was monitored for 4-hours and was discharged to home due to fetus appearing well. She returned several hours later and was found to have an IUFD. The patient has a history of prior C-section due to breech presentation and due to desire to have subsequent vaginal deliveries, a trial of induction was attempted with pitocin and Foley bulb. However, due to failure to progress and persistent abdominal pain, an ultrasound was acquired, which showed uterine rupture. The patient when taken to the OR for an emergent C-section that showed about 2 L of clotted blood in the peritoneum. Post-partum, the patient received 4U PRBC due to her hemoglobin dropping from 11.2 to 7.3. The patient did well physically post-operatively, grieving appropriately. The  patient was discharged to home on POD#3. She will follow-up with her PCP in a few days for staple removal and then in 4-weeks for a post-partum follow-up.  OH PARK, ANGELA  02/19/2012, 10:00 AM    Pt discharged as above Prisila Dlouhy

## 2012-02-26 ENCOUNTER — Telehealth: Payer: Self-pay | Admitting: Family Medicine

## 2012-02-26 NOTE — Telephone Encounter (Signed)
Patient feeling increased pain abdominal incision. She feels pain is inside. She wants to know if her uterus could possibly be rupturing.  Her skin incision is intact according to patient.  We discussed how it is unlikely her uterus incision is not intact. Her abdominal pain is moderate at this time.  It is understandable she is concerned because of what she went through recently. She asked me multiple times during her most recent visit whether a follow-up ultrasound will be ordered. I told her at that time and today that it is not routine to get a follow-up ultrasound following a C-section.  I offered to order the ultrasound today to reevaluate her uterine incision, however, the patient declined at this time.   She has follow-up with Ob/Gyn Dr. Lynetta Mare and with me in the next few weeks.

## 2012-02-26 NOTE — Telephone Encounter (Signed)
Please call patient regarding pain to lower abd.  Unsure if related to appt she have with [facility].  May need to come in for evaulation.

## 2012-03-07 ENCOUNTER — Ambulatory Visit (INDEPENDENT_AMBULATORY_CARE_PROVIDER_SITE_OTHER): Payer: Self-pay | Admitting: Obstetrics & Gynecology

## 2012-03-07 ENCOUNTER — Encounter: Payer: Self-pay | Admitting: Obstetrics & Gynecology

## 2012-03-07 VITALS — BP 93/70 | HR 92 | Temp 98.0°F | Ht 62.0 in | Wt 128.0 lb

## 2012-03-07 DIAGNOSIS — IMO0002 Reserved for concepts with insufficient information to code with codable children: Secondary | ICD-10-CM

## 2012-03-07 DIAGNOSIS — S3769XA Other injury of uterus, initial encounter: Secondary | ICD-10-CM

## 2012-03-07 NOTE — Patient Instructions (Signed)
Preventive Care for Adults, Female A healthy lifestyle and preventive care can promote health and wellness. Preventive health guidelines for women include the following key practices.  A routine yearly physical is a good way to check with your caregiver about your health and preventive screening. It is a chance to share any concerns and updates on your health, and to receive a thorough exam.   Visit your dentist for a routine exam and preventive care every 6 months. Brush your teeth twice a day and floss once a day. Good oral hygiene prevents tooth decay and gum disease.   The frequency of eye exams is based on your age, health, family medical history, use of contact lenses, and other factors. Follow your caregiver's recommendations for frequency of eye exams.   Eat a healthy diet. Foods like vegetables, fruits, whole grains, low-fat dairy products, and lean protein foods contain the nutrients you need without too many calories. Decrease your intake of foods high in solid fats, added sugars, and salt. Eat the right amount of calories for you.Get information about a proper diet from your caregiver, if necessary.   Regular physical exercise is one of the most important things you can do for your health. Most adults should get at least 150 minutes of moderate-intensity exercise (any activity that increases your heart rate and causes you to sweat) each week. In addition, most adults need muscle-strengthening exercises on 2 or more days a week.   Maintain a healthy weight. The body mass index (BMI) is a screening tool to identify possible weight problems. It provides an estimate of body fat based on height and weight. Your caregiver can help determine your BMI, and can help you achieve or maintain a healthy weight.For adults 20 years and older:   A BMI below 18.5 is considered underweight.   A BMI of 18.5 to 24.9 is normal.   A BMI of 25 to 29.9 is considered overweight.   A BMI of 30 and above is  considered obese.   Maintain normal blood lipids and cholesterol levels by exercising and minimizing your intake of saturated fat. Eat a balanced diet with plenty of fruit and vegetables. Blood tests for lipids and cholesterol should begin at age 20 and be repeated every 5 years. If your lipid or cholesterol levels are high, you are over 50, or you are at high risk for heart disease, you may need your cholesterol levels checked more frequently.Ongoing high lipid and cholesterol levels should be treated with medicines if diet and exercise are not effective.   If you smoke, find out from your caregiver how to quit. If you do not use tobacco, do not start.   If you are pregnant, do not drink alcohol. If you are breastfeeding, be very cautious about drinking alcohol. If you are not pregnant and choose to drink alcohol, do not exceed 1 drink per day. One drink is considered to be 12 ounces (355 mL) of beer, 5 ounces (148 mL) of wine, or 1.5 ounces (44 mL) of liquor.   Avoid use of street drugs. Do not share needles with anyone. Ask for help if you need support or instructions about stopping the use of drugs.   High blood pressure causes heart disease and increases the risk of stroke. Your blood pressure should be checked at least every 1 to 2 years. Ongoing high blood pressure should be treated with medicines if weight loss and exercise are not effective.   If you are 55 to 33   years old, ask your caregiver if you should take aspirin to prevent strokes.   Diabetes screening involves taking a blood sample to check your fasting blood sugar level. This should be done once every 3 years, after age 45, if you are within normal weight and without risk factors for diabetes. Testing should be considered at a younger age or be carried out more frequently if you are overweight and have at least 1 risk factor for diabetes.   Breast cancer screening is essential preventive care for women. You should practice "breast  self-awareness." This means understanding the normal appearance and feel of your breasts and may include breast self-examination. Any changes detected, no matter how small, should be reported to a caregiver. Women in their 20s and 30s should have a clinical breast exam (CBE) by a caregiver as part of a regular health exam every 1 to 3 years. After age 40, women should have a CBE every year. Starting at age 40, women should consider having a mammography (breast X-ray test) every year. Women who have a family history of breast cancer should talk to their caregiver about genetic screening. Women at a high risk of breast cancer should talk to their caregivers about having magnetic resonance imaging (MRI) and a mammography every year.   The Pap test is a screening test for cervical cancer. A Pap test can show cell changes on the cervix that might become cervical cancer if left untreated. A Pap test is a procedure in which cells are obtained and examined from the lower end of the uterus (cervix).   Women should have a Pap test starting at age 21.   Between ages 21 and 29, Pap tests should be repeated every 2 years.   Beginning at age 30, you should have a Pap test every 3 years as long as the past 3 Pap tests have been normal.   Some women have medical problems that increase the chance of getting cervical cancer. Talk to your caregiver about these problems. It is especially important to talk to your caregiver if a new problem develops soon after your last Pap test. In these cases, your caregiver may recommend more frequent screening and Pap tests.   The above recommendations are the same for women who have or have not gotten the vaccine for human papillomavirus (HPV).   If you had a hysterectomy for a problem that was not cancer or a condition that could lead to cancer, then you no longer need Pap tests. Even if you no longer need a Pap test, a regular exam is a good idea to make sure no other problems are  starting.   If you are between ages 65 and 70, and you have had normal Pap tests going back 10 years, you no longer need Pap tests. Even if you no longer need a Pap test, a regular exam is a good idea to make sure no other problems are starting.   If you have had past treatment for cervical cancer or a condition that could lead to cancer, you need Pap tests and screening for cancer for at least 20 years after your treatment.   If Pap tests have been discontinued, risk factors (such as a new sexual partner) need to be reassessed to determine if screening should be resumed.   The HPV test is an additional test that may be used for cervical cancer screening. The HPV test looks for the virus that can cause the cell changes on the cervix.   The cells collected during the Pap test can be tested for HPV. The HPV test could be used to screen women aged 30 years and older, and should be used in women of any age who have unclear Pap test results. After the age of 30, women should have HPV testing at the same frequency as a Pap test.   Colorectal cancer can be detected and often prevented. Most routine colorectal cancer screening begins at the age of 50 and continues through age 75. However, your caregiver may recommend screening at an earlier age if you have risk factors for colon cancer. On a yearly basis, your caregiver may provide home test kits to check for hidden blood in the stool. Use of a small camera at the end of a tube, to directly examine the colon (sigmoidoscopy or colonoscopy), can detect the earliest forms of colorectal cancer. Talk to your caregiver about this at age 50, when routine screening begins. Direct examination of the colon should be repeated every 5 to 10 years through age 75, unless early forms of pre-cancerous polyps or small growths are found.   Hepatitis C blood testing is recommended for all people born from 1945 through 1965 and any individual with known risks for hepatitis C.    Practice safe sex. Use condoms and avoid high-risk sexual practices to reduce the spread of sexually transmitted infections (STIs). STIs include gonorrhea, chlamydia, syphilis, trichomonas, herpes, HPV, and human immunodeficiency virus (HIV). Herpes, HIV, and HPV are viral illnesses that have no cure. They can result in disability, cancer, and death. Sexually active women aged 25 and younger should be checked for chlamydia. Older women with new or multiple partners should also be tested for chlamydia. Testing for other STIs is recommended if you are sexually active and at increased risk.   Osteoporosis is a disease in which the bones lose minerals and strength with aging. This can result in serious bone fractures. The risk of osteoporosis can be identified using a bone density scan. Women ages 65 and over and women at risk for fractures or osteoporosis should discuss screening with their caregivers. Ask your caregiver whether you should take a calcium supplement or vitamin D to reduce the rate of osteoporosis.   Menopause can be associated with physical symptoms and risks. Hormone replacement therapy is available to decrease symptoms and risks. You should talk to your caregiver about whether hormone replacement therapy is right for you.   Use sunscreen with sun protection factor (SPF) of 30 or more. Apply sunscreen liberally and repeatedly throughout the day. You should seek shade when your shadow is shorter than you. Protect yourself by wearing long sleeves, pants, a wide-brimmed hat, and sunglasses year round, whenever you are outdoors.   Once a month, do a whole body skin exam, using a mirror to look at the skin on your back. Notify your caregiver of new moles, moles that have irregular borders, moles that are larger than a pencil eraser, or moles that have changed in shape or color.   Stay current with required immunizations.   Influenza. You need a dose every fall (or winter). The composition of  the flu vaccine changes each year, so being vaccinated once is not enough.   Pneumococcal polysaccharide. You need 1 to 2 doses if you smoke cigarettes or if you have certain chronic medical conditions. You need 1 dose at age 65 (or older) if you have never been vaccinated.   Tetanus, diphtheria, pertussis (Tdap, Td). Get 1 dose of   Tdap vaccine if you are younger than age 65, are over 65 and have contact with an infant, are a healthcare worker, are pregnant, or simply want to be protected from whooping cough. After that, you need a Td booster dose every 10 years. Consult your caregiver if you have not had at least 3 tetanus and diphtheria-containing shots sometime in your life or have a deep or dirty wound.   HPV. You need this vaccine if you are a woman age 26 or younger. The vaccine is given in 3 doses over 6 months.   Measles, mumps, rubella (MMR). You need at least 1 dose of MMR if you were born in 1957 or later. You may also need a second dose.   Meningococcal. If you are age 19 to 21 and a first-year college student living in a residence hall, or have one of several medical conditions, you need to get vaccinated against meningococcal disease. You may also need additional booster doses.   Zoster (shingles). If you are age 60 or older, you should get this vaccine.   Varicella (chickenpox). If you have never had chickenpox or you were vaccinated but received only 1 dose, talk to your caregiver to find out if you need this vaccine.   Hepatitis A. You need this vaccine if you have a specific risk factor for hepatitis A virus infection or you simply wish to be protected from this disease. The vaccine is usually given as 2 doses, 6 to 18 months apart.   Hepatitis B. You need this vaccine if you have a specific risk factor for hepatitis B virus infection or you simply wish to be protected from this disease. The vaccine is given in 3 doses, usually over 6 months.  Preventive Services /  Frequency Ages 19 to 39  Blood pressure check.** / Every 1 to 2 years.   Lipid and cholesterol check.** / Every 5 years beginning at age 20.   Clinical breast exam.** / Every 3 years for women in their 20s and 30s.   Pap test.** / Every 2 years from ages 21 through 29. Every 3 years starting at age 30 through age 65 or 70 with a history of 3 consecutive normal Pap tests.   HPV screening.** / Every 3 years from ages 30 through ages 65 to 70 with a history of 3 consecutive normal Pap tests.   Hepatitis C blood test.** / For any individual with known risks for hepatitis C.   Skin self-exam. / Monthly.   Influenza immunization.** / Every year.   Pneumococcal polysaccharide immunization.** / 1 to 2 doses if you smoke cigarettes or if you have certain chronic medical conditions.   Tetanus, diphtheria, pertussis (Tdap, Td) immunization. / A one-time dose of Tdap vaccine. After that, you need a Td booster dose every 10 years.   HPV immunization. / 3 doses over 6 months, if you are 26 and younger.   Measles, mumps, rubella (MMR) immunization. / You need at least 1 dose of MMR if you were born in 1957 or later. You may also need a second dose.   Meningococcal immunization. / 1 dose if you are age 19 to 21 and a first-year college student living in a residence hall, or have one of several medical conditions, you need to get vaccinated against meningococcal disease. You may also need additional booster doses.   Varicella immunization.** / Consult your caregiver.   Hepatitis A immunization.** / Consult your caregiver. 2 doses, 6 to 18 months   apart.   Hepatitis B immunization.** / Consult your caregiver. 3 doses usually over 6 months.  Ages 40 to 64  Blood pressure check.** / Every 1 to 2 years.   Lipid and cholesterol check.** / Every 5 years beginning at age 20.   Clinical breast exam.** / Every year after age 40.   Mammogram.** / Every year beginning at age 40 and continuing for as  long as you are in good health. Consult with your caregiver.   Pap test.** / Every 3 years starting at age 30 through age 65 or 70 with a history of 3 consecutive normal Pap tests.   HPV screening.** / Every 3 years from ages 30 through ages 65 to 70 with a history of 3 consecutive normal Pap tests.   Fecal occult blood test (FOBT) of stool. / Every year beginning at age 50 and continuing until age 75. You may not need to do this test if you get a colonoscopy every 10 years.   Flexible sigmoidoscopy or colonoscopy.** / Every 5 years for a flexible sigmoidoscopy or every 10 years for a colonoscopy beginning at age 50 and continuing until age 75.   Hepatitis C blood test.** / For all people born from 1945 through 1965 and any individual with known risks for hepatitis C.   Skin self-exam. / Monthly.   Influenza immunization.** / Every year.   Pneumococcal polysaccharide immunization.** / 1 to 2 doses if you smoke cigarettes or if you have certain chronic medical conditions.   Tetanus, diphtheria, pertussis (Tdap, Td) immunization.** / A one-time dose of Tdap vaccine. After that, you need a Td booster dose every 10 years.   Measles, mumps, rubella (MMR) immunization. / You need at least 1 dose of MMR if you were born in 1957 or later. You may also need a second dose.   Varicella immunization.** / Consult your caregiver.   Meningococcal immunization.** / Consult your caregiver.   Hepatitis A immunization.** / Consult your caregiver. 2 doses, 6 to 18 months apart.   Hepatitis B immunization.** / Consult your caregiver. 3 doses, usually over 6 months.  Ages 65 and over  Blood pressure check.** / Every 1 to 2 years.   Lipid and cholesterol check.** / Every 5 years beginning at age 20.   Clinical breast exam.** / Every year after age 40.   Mammogram.** / Every year beginning at age 40 and continuing for as long as you are in good health. Consult with your caregiver.   Pap test.** /  Every 3 years starting at age 30 through age 65 or 70 with a 3 consecutive normal Pap tests. Testing can be stopped between 65 and 70 with 3 consecutive normal Pap tests and no abnormal Pap or HPV tests in the past 10 years.   HPV screening.** / Every 3 years from ages 30 through ages 65 or 70 with a history of 3 consecutive normal Pap tests. Testing can be stopped between 65 and 70 with 3 consecutive normal Pap tests and no abnormal Pap or HPV tests in the past 10 years.   Fecal occult blood test (FOBT) of stool. / Every year beginning at age 50 and continuing until age 75. You may not need to do this test if you get a colonoscopy every 10 years.   Flexible sigmoidoscopy or colonoscopy.** / Every 5 years for a flexible sigmoidoscopy or every 10 years for a colonoscopy beginning at age 50 and continuing until age 75.   Hepatitis   C blood test.** / For all people born from 1945 through 1965 and any individual with known risks for hepatitis C.   Osteoporosis screening.** / A one-time screening for women ages 65 and over and women at risk for fractures or osteoporosis.   Skin self-exam. / Monthly.   Influenza immunization.** / Every year.   Pneumococcal polysaccharide immunization.** / 1 dose at age 65 (or older) if you have never been vaccinated.   Tetanus, diphtheria, pertussis (Tdap, Td) immunization. / A one-time dose of Tdap vaccine if you are over 65 and have contact with an infant, are a healthcare worker, or simply want to be protected from whooping cough. After that, you need a Td booster dose every 10 years.   Varicella immunization.** / Consult your caregiver.   Meningococcal immunization.** / Consult your caregiver.   Hepatitis A immunization.** / Consult your caregiver. 2 doses, 6 to 18 months apart.   Hepatitis B immunization.** / Check with your caregiver. 3 doses, usually over 6 months.  ** Family history and personal history of risk and conditions may change your caregiver's  recommendations. Document Released: 01/23/2002 Document Revised: 11/16/2011 Document Reviewed: 04/24/2011 [employer] Patient Information 2012 [employer]. 

## 2012-03-07 NOTE — Progress Notes (Signed)
  Subjective:     Brenda Solomon is a 31 y.o. female who presents for a postpartum/postoperative visit.  She had a history of IUFD and uterine rupture at [redacted] weeks GA, and needed urgent surgery for delivery and repair of uterine rupture. She had an uncomplicated postoperative course but it appropriately sad about her loss and anxious about future pregnancies. She also wants to make sure her uterus is okay; wants to know the operative findings.  Bleeding is minimal. Bowel function is normal. Bladder function is normal. Patient is not sexually active. Contraception method is condoms.   The following portions of the patient's history were reviewed and updated as appropriate: allergies, current medications, past family history, past medical history, past social history, past surgical history and problem list.  Review of Systems Pertinent items are noted in HPI.   Objective:    BP 93/70  Pulse 92  Temp(Src) 98 F (36.7 C) (Oral)  Ht 5\' 2"  (1.575 m)  Wt 128 lb (58.06 kg)  BMI 23.41 kg/m2  LMP 02/16/2012  General:  alert and no distress  Abdomen: soft, non-tender; bowel sounds normal; no masses,  no organomegaly. Incision clean,dry, intact, no erythema, healing well  Pelvic: Deferred        Assessment:   Patient with normal postoperative exam; appropriately grieving given her outcome.  Plan:  Patient was counseled about future pregnancies, was told to wait at least one year before attempting to conceive again. If she gets pregnant again, recommend delivery with cesarean section at 36 weeks to attempt to prevent recurrent uterine rupture.  Support was given to the patient and her family; recommended Heartstrings.  Operative findings reviewed in detail, all questions answered. No indication for ultrasound at this point.  Patient was told to return to clinic for any concerns.

## 2012-03-20 ENCOUNTER — Ambulatory Visit (INDEPENDENT_AMBULATORY_CARE_PROVIDER_SITE_OTHER): Payer: Self-pay | Admitting: Family Medicine

## 2012-03-20 ENCOUNTER — Encounter: Payer: Self-pay | Admitting: Family Medicine

## 2012-03-20 VITALS — BP 92/58 | HR 84 | Temp 97.8°F | Ht 62.0 in | Wt 128.0 lb

## 2012-03-20 DIAGNOSIS — F4321 Adjustment disorder with depressed mood: Secondary | ICD-10-CM | POA: Insufficient documentation

## 2012-03-20 DIAGNOSIS — IMO0002 Reserved for concepts with insufficient information to code with codable children: Secondary | ICD-10-CM

## 2012-03-20 NOTE — Progress Notes (Signed)
  Subjective:     Brenda Solomon is a 31 y.o. female who presents for a postpartum visit. I have fully reviewed the prenatal and intrapartum course.  The patient lost her baby at about 37 weeks due to uterine rupture. She is still struggling with grief. She denies changes in appetite or sleep but does have a difficult time not thinking about her baby. It is difficult to speak with her husband regarding what happened because he seems to feel guilty when she cries, so she has to cry privately.  She cares for her daughter during the day and seems to enjoy this. Her husband is a Consulting civil engineer and has been busy with his studies, which she thinks is helping him recover.   The patient is sexually active and uses condoms for contraception.  Review of Systems Pertinent items are noted in HPI.  Normal bladder and bowel function.   Objective:    BP 92/58  Pulse 84  Temp(Src) 97.8 F (36.6 C) (Oral)  Ht 5\' 2"  (1.575 m)  Wt 128 lb (58.06 kg)  BMI 23.41 kg/m2  LMP 02/16/2012  General:  alert, fatigued and no distress   Breasts:  inspection negative, no nipple discharge or bleeding, no masses or nodularity palpable and   Lungs:   Heart:    Abdomen: soft, non-tender; bowel sounds normal; no masses,  no organomegaly and lower transverse scar healing well   Vulva:  not evaluated  Vagina: not evaluated  Cervix:    Corpus:   Adnexa:    Rectal Exam:         Assessment:    6-week postpartum exam.   Plan:

## 2012-03-20 NOTE — Assessment & Plan Note (Signed)
See IUFD A/P.

## 2012-03-20 NOTE — Assessment & Plan Note (Signed)
Patient here for her 6-week post-partum visit. She is still struggling with grief.  Discussed counseling and anti-depressants. Patient is interested in speaking with a counselor at this time. I think the patient would benefit from speaking to a counselor since it is difficult to speak with her husband regarding this, and the patient feels uncomfortable speaking with other family members/friends. I will give her the number to our clinical psychologist Dr. Pascal Lux. The patient will follow-up with me in 1 month. We will discuss contraception further at that time. The patient would like to avoid pregnancy for the next 1-2 years, however, since she had problems with fertility in the past (prior to her first pregnancy; she is a G2), she is wary about birth control outside of condoms. She did not have any difficulties becoming pregnancy her second time. Discussed fertility is usually regained a few months following cessation of birth control and the risk of condoms breaking. Handout about birth control options given.  Patient followed-up at [facility] post-partum, and they are recommending scheduled C-section at 36-weeks for future pregnancies.

## 2012-03-20 NOTE — Patient Instructions (Addendum)
Call and make an appointment with psychologist Dr. Pascal Lux, [phone].   Follow-up in 1 month. We can discuss contraceptions further at that visit.  If you need to come in sooner, I would be happy to see you. Please call and schedule an appointment.   I will be praying for your Brenda Solomon.   Contraception Choices Contraception (birth control) is the use of any methods or devices to prevent pregnancy. Below are some methods to help avoid pregnancy. HORMONAL METHODS   Contraceptive implant. This is a thin, plastic tube containing progesterone hormone. It does not contain estrogen hormone. Your caregiver inserts the tube in the inner part of the upper arm. The tube can remain in place for up to 3 years. After 3 years, the implant must be removed. The implant prevents the ovaries from releasing an egg (ovulation), thickens the cervical mucus which prevents sperm from entering the uterus, and thins the lining of the inside of the uterus.   Progesterone-only injections. These injections are given every 3 months by your caregiver to prevent pregnancy. This synthetic progesterone hormone stops the ovaries from releasing eggs. It also thickens cervical mucus and changes the uterine lining. This makes it harder for sperm to survive in the uterus.   Birth control pills. These pills contain estrogen and progesterone hormone. They work by stopping the egg from forming in the ovary (ovulation). Birth control pills are prescribed by a caregiver.Birth control pills can also be used to treat heavy periods.   Minipill. This type of birth control pill contains only the progesterone hormone. They are taken every day of each month and must be prescribed by your caregiver.   Birth control patch. The patch contains hormones similar to those in birth control pills. It must be changed once a week and is prescribed by a caregiver.   Vaginal ring. The ring contains hormones similar to those in birth control pills. It is left in  the vagina for 3 weeks, removed for 1 week, and then a new one is put back in place. The patient must be comfortable inserting and removing the ring from the vagina.A caregiver's prescription is necessary.   Emergency contraception. Emergency contraceptives prevent pregnancy after unprotected sexual intercourse. This pill can be taken right after sex or up to 5 days after unprotected sex. It is most effective the sooner you take the pills after having sexual intercourse. Emergency contraceptive pills are available without a prescription. Check with your pharmacist. Do not use emergency contraception as your only form of birth control.  BARRIER METHODS   Female condom. This is a thin sheath (latex or rubber) that is worn over the penis during sexual intercourse. It can be used with spermicide to increase effectiveness.   Female condom. This is a soft, loose-fitting sheath that is put into the vagina before sexual intercourse.   Diaphragm. This is a soft, latex, dome-shaped barrier that must be fitted by a caregiver. It is inserted into the vagina, along with a spermicidal jelly. It is inserted before intercourse. The diaphragm should be left in the vagina for 6 to 8 hours after intercourse.   Cervical cap. This is a round, soft, latex or plastic cup that fits over the cervix and must be fitted by a caregiver. The cap can be left in place for up to 48 hours after intercourse.   Sponge. This is a soft, circular piece of polyurethane foam. The sponge has spermicide in it. It is inserted into the vagina after wetting it  and before sexual intercourse.   Spermicides. These are chemicals that kill or block sperm from entering the cervix and uterus. They come in the form of creams, jellies, suppositories, foam, or tablets. They do not require a prescription. They are inserted into the vagina with an applicator before having sexual intercourse. The process must be repeated every time you have sexual intercourse.   INTRAUTERINE CONTRACEPTION  Intrauterine device (IUD). This is a T-shaped device that is put in a woman's uterus during a menstrual period to prevent pregnancy. There are 2 types:   Copper IUD. This type of IUD is wrapped in copper wire and is placed inside the uterus. Copper makes the uterus and fallopian tubes produce a fluid that kills sperm. It can stay in place for 10 years.   Hormone IUD. This type of IUD contains the hormone progestin (synthetic progesterone). The hormone thickens the cervical mucus and prevents sperm from entering the uterus, and it also thins the uterine lining to prevent implantation of a fertilized egg. The hormone can weaken or kill the sperm that get into the uterus. It can stay in place for 5 years.  PERMANENT METHODS OF CONTRACEPTION  Female tubal ligation. This is when the woman's fallopian tubes are surgically sealed, tied, or blocked to prevent the egg from traveling to the uterus.   Female sterilization. This is when the female has the tubes that carry sperm tied off (vasectomy).This blocks sperm from entering the vagina during sexual intercourse. After the procedure, the man can still ejaculate fluid (semen).  NATURAL PLANNING METHODS  Natural family planning. This is not having sexual intercourse or using a barrier method (condom, diaphragm, cervical cap) on days the woman could become pregnant.   Calendar method. This is keeping track of the length of each menstrual cycle and identifying when you are fertile.   Ovulation method. This is avoiding sexual intercourse during ovulation.   Symptothermal method. This is avoiding sexual intercourse during ovulation, using a thermometer and ovulation symptoms.   Post-ovulation method. This is timing sexual intercourse after you have ovulated.  Regardless of which type or method of contraception you choose, it is important that you use condoms to protect against the transmission of sexually transmitted diseases  (STDs). Talk with your caregiver about which form of contraception is most appropriate for you. Document Released: 11/27/2005 Document Revised: 11/16/2011 Document Reviewed: 04/05/2011 [facility] Patient Information 2012 [place], [place].

## 2012-04-15 ENCOUNTER — Ambulatory Visit (INDEPENDENT_AMBULATORY_CARE_PROVIDER_SITE_OTHER): Payer: Self-pay | Admitting: Family Medicine

## 2012-04-15 VITALS — BP 97/70 | HR 112 | Ht 62.0 in | Wt 128.0 lb

## 2012-04-15 DIAGNOSIS — Z309 Encounter for contraceptive management, unspecified: Secondary | ICD-10-CM

## 2012-04-15 DIAGNOSIS — F4321 Adjustment disorder with depressed mood: Secondary | ICD-10-CM

## 2012-04-15 MED ORDER — NORETHINDRONE 0.35 MG PO TABS: 1.0000 | ORAL_TABLET | Freq: Every day | ORAL | Status: DC

## 2012-04-15 NOTE — Progress Notes (Signed)
  Subjective:    Patient ID: Brenda Solomon, female    DOB: 04/23/1981, 31 y.o.   MRN: [id number]  HPI Follow-up: grief  Patient doing better. She has fewer crying spells but does get sad at nighttime when her daughter and husband are asleep. 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   2. Contraception management Patient has irregular periods since delivery, every 45 days to months.  She is still breast-feeding her 24-year-old daughter.  Review of Systems Per HPI    Objective:   Physical Exam Gen: NAD Psych: pleasant, smiles more, conversant, appropriate    Assessment & Plan:

## 2012-04-15 NOTE — Patient Instructions (Signed)
Please call and make a nurse visit for a Depo-shot (birth control) when you return from your country or schedule a visit with me if you want the Nexplanon (birth control inserted under the skin).  Follow-up in 1 year for your next physical.   I am glad to see you smiling more today. : )

## 2012-04-15 NOTE — Assessment & Plan Note (Signed)
Improving.

## 2012-04-15 NOTE — Assessment & Plan Note (Signed)
Micronor [place] in [place]. She would like Depo-Provera at that time. Also interested in Nexplanon but cost is a barrier, so will most likely prefer Depo.

## 2013-05-16 ENCOUNTER — Encounter: Payer: Self-pay | Admitting: Neurology

## 2013-05-16 ENCOUNTER — Ambulatory Visit (INDEPENDENT_AMBULATORY_CARE_PROVIDER_SITE_OTHER): Payer: BC Managed Care – PPO | Admitting: Neurology

## 2013-05-16 VITALS — BP 109/67 | HR 85 | Ht 62.0 in | Wt 128.0 lb

## 2013-05-16 DIAGNOSIS — R209 Unspecified disturbances of skin sensation: Secondary | ICD-10-CM

## 2013-05-16 DIAGNOSIS — R252 Cramp and spasm: Secondary | ICD-10-CM

## 2013-05-16 DIAGNOSIS — R202 Paresthesia of skin: Secondary | ICD-10-CM

## 2013-05-16 MED ORDER — CITALOPRAM HYDROBROMIDE 10 MG PO TABS: 10.0000 mg | ORAL_TABLET | Freq: Every day | ORAL | Status: DC

## 2013-05-16 NOTE — Progress Notes (Signed)
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   Constitutional:   N/A Cardiovascular:  N/A Ear/Nose/Throat:  N/A Skin: N/A Eyes: Eye pain Respiratory: N/A Gastroitestinal: N/A    Hematology/Lymphatic:  N/A Endocrine:  Feeling hot Musculoskeletal: Muscle cramping Allergy/Immunology: N/A Neurological: Memory loss, weakness Psychiatric:    Depression anxiety, decreased energy  PHYSICAL EXAMINATOINS:  Generalized: In no acute distress  Neck: Supple, no carotid bruits   Cardiac: Regular rate rhythm  Pulmonary: Clear to auscultation bilaterally  Musculoskeletal: No deformity  Neurological examination  Mentation: Alert oriented to time, place, history taking, and causual conversation  Cranial nerve II-XII: Pupils were equal round reactive to light extraocular movements were full, visual field were full on confrontational test. facial sensation and strength  were normal. hearing was intact to finger rubbing bilaterally. Uvula tongue midline.  head turning and shoulder shrug and were normal and symmetric.Tongue protrusion into cheek strength was normal.  Motor: normal tone, bulk and strength.  Sensory: Intact to fine touch, pinprick, preserved vibratory sensation, and proprioception at toes.  Coordination: Normal finger to nose, heel-to-shin bilaterally there was no truncal ataxia  Gait: Rising up from seated position without assistance, normal stance, without trunk ataxia, moderate stride, good arm swing, smooth turning, able to perform tiptoe, and heel walking without difficulty.   Romberg signs: Negative  Deep tendon reflexes: Brachioradialis 2/2, biceps 2/2, triceps 2/2, patellar 2/2, Achilles 2/2, plantar responses were flexor bilaterally.   Assessment and plan,  32 years old Asian female, with constellation of complaints, including bilateral upper and lower extremity paresthesia, muscle cramping, normal neurological examination, she has depression and anxiety,  1 after discussion, we decided to hold off further evaluation, will  Start  her on low-dose Celexa 10 mg every day, 2. RTC as needed.

## 2014-01-09 ENCOUNTER — Encounter (HOSPITAL_COMMUNITY): Payer: Self-pay | Admitting: Emergency Medicine

## 2014-01-09 ENCOUNTER — Emergency Department (HOSPITAL_COMMUNITY): Payer: BC Managed Care – PPO

## 2014-01-09 ENCOUNTER — Emergency Department (HOSPITAL_COMMUNITY)
Admission: EM | Admit: 2014-01-09 | Discharge: 2014-01-09 | Disposition: A | Discharge status: Nursing Home (Includes ICF) | Payer: BC Managed Care – PPO | Source: Home / Self Care

## 2014-01-09 ENCOUNTER — Emergency Department (HOSPITAL_COMMUNITY)
Admission: EM | Admit: 2014-01-09 | Discharge: 2014-01-09 | Disposition: A | Discharge status: Home / Self Care | Payer: BC Managed Care – PPO | Attending: Emergency Medicine | Admitting: Emergency Medicine

## 2014-01-09 DIAGNOSIS — N61 Mastitis without abscess: Secondary | ICD-10-CM

## 2014-01-09 DIAGNOSIS — Z9889 Other specified postprocedural states: Secondary | ICD-10-CM | POA: Insufficient documentation

## 2014-01-09 DIAGNOSIS — N611 Abscess of the breast and nipple: Secondary | ICD-10-CM

## 2014-01-09 LAB — CBC WITH DIFFERENTIAL/PLATELET
BASOS ABS: 0 10*3/uL (ref 0.0–0.1)
Basophils Relative: 0 % (ref 0–1)
Eosinophils Absolute: 0.2 10*3/uL (ref 0.0–0.7)
Eosinophils Relative: 2 % (ref 0–5)
HCT: 35.7 % — ABNORMAL LOW (ref 36.0–46.0)
Hemoglobin: 12.1 g/dL (ref 12.0–15.0)
LYMPHS ABS: 2.1 10*3/uL (ref 0.7–4.0)
LYMPHS PCT: 17 % (ref 12–46)
MCH: 29.8 pg (ref 26.0–34.0)
MCHC: 33.9 g/dL (ref 30.0–36.0)
MCV: 87.9 fL (ref 78.0–100.0)
Monocytes Absolute: 0.9 10*3/uL (ref 0.1–1.0)
Monocytes Relative: 7 % (ref 3–12)
Neutro Abs: 9 10*3/uL — ABNORMAL HIGH (ref 1.7–7.7)
Neutrophils Relative %: 74 % (ref 43–77)
PLATELETS: 321 10*3/uL (ref 150–400)
RBC: 4.06 MIL/uL (ref 3.87–5.11)
RDW: 13 % (ref 11.5–15.5)
WBC: 12.2 10*3/uL — AB (ref 4.0–10.5)

## 2014-01-09 LAB — BASIC METABOLIC PANEL
BUN: 8 mg/dL (ref 6–23)
CO2: 23 meq/L (ref 19–32)
Calcium: 9.2 mg/dL (ref 8.4–10.5)
Chloride: 99 mEq/L (ref 96–112)
Creatinine, Ser: 0.68 mg/dL (ref 0.50–1.10)
GFR calc Af Amer: >90 mL/min (ref >90)
GFR calc non Af Amer: >90 mL/min (ref >90)
GLUCOSE: 110 mg/dL — AB (ref 70–99)
POTASSIUM: 3.7 meq/L (ref 3.7–5.3)
Sodium: 139 mEq/L (ref 137–147)

## 2014-01-09 MED ORDER — ACETAMINOPHEN 325 MG PO TABS
ORAL_TABLET | ORAL | Status: AC
  Filled 2014-01-09: qty 2

## 2014-01-09 MED ORDER — ACETAMINOPHEN 325 MG PO TABS
650.0000 mg | ORAL_TABLET | Freq: Once | ORAL | Status: AC
  Administered 2014-01-09: 650 mg via ORAL

## 2014-01-09 MED ORDER — OXYCODONE-ACETAMINOPHEN 5-325 MG PO TABS: 1.0000 | ORAL_TABLET | ORAL | Status: DC | PRN

## 2014-01-09 MED ORDER — IBUPROFEN 600 MG PO TABS: 600.0000 mg | ORAL_TABLET | Freq: Three times a day (TID) | ORAL | Status: DC | PRN

## 2014-01-09 NOTE — ED Notes (Signed)
Dr. Campos into room 

## 2014-01-09 NOTE — Discharge Instructions (Signed)
Mastitis   Mastitis is inflammation of the breast tissue. It occurs most often in women who are breastfeeding, but it can also affect other women, and even sometimes men.  CAUSES   Mastitis is usually caused by a bacterial infection. Bacteria enter the breast tissue through cuts or openings in the skin. Typically, this occurs with breastfeeding because of cracked or irritated skin. Sometimes, it can occur even when there is no opening in the skin. It can be associated with plugged milk (lactiferous) ducts. Nipple piercing can also lead to mastitis. Also, some forms of breast cancer can cause mastitis.  SIGNS AND SYMPTOMS   · Swelling, redness, tenderness, and pain in an area of the breast.  · Swelling of the glands under the arm on the same side.  · Fever.  If an infection is allowed to progress, a collection of pus (abscess) may develop.  DIAGNOSIS   Your health care provider can usually diagnose mastitis based on your symptoms and a physical exam. Tests may be done to help confirm the diagnosis. These may include:   · Removal of pus from the breast by applying pressure to the area. This pus can be examined in the lab to determine which bacteria are present. If an abscess has developed, the fluid in the abscess can be removed with a needle. This can also be used to confirm the diagnosis and determine the bacteria present. In most cases, pus will not be present.  · Blood tests to determine if your body is fighting a bacterial infection.  · Mammogram or ultrasound tests to rule out other problems or diseases.  TREATMENT   Antibiotic medicine is used to treat a bacterial infection. Your health care provider will determine which bacteria are most likely causing the infection and will select an appropriate antibiotic. This is sometimes changed based on the results of tests performed to identify the bacteria, or if there is no response to the antibiotic selected. Antibiotics are usually given by mouth. You may also be  given medicine for pain.  Mastitis that occurs with breastfeeding will sometimes go away on its own, so your health care provider may choose to wait 24 hours after first seeing you to decide whether a prescription medicine is needed.  HOME CARE INSTRUCTIONS   · Only take over-the-counter or prescription medicines for pain, fever, or discomfort as directed by your health care provider.  · If your health care provider prescribed an antibiotic, take the medicine as directed. Make sure you finish it even if you start to feel better.  · Do not wear a tight or underwire bra. Wear a soft, supportive bra.  · Increase your fluid intake, especially if you have a fever.  · Women who are breastfeeding should follow these instructions:  · Continue to empty the breast. Your health care provider can tell you whether this milk is safe for your infant or needs to be thrown out. You may be told to stop nursing until your health care provider thinks it is safe for your baby. Use a breast pump if you are advised to stop nursing.  · Keep your nipples clean and dry.  · Empty the first breast completely before going to the other breast. If your baby is not emptying your breasts completely for some reason, use a breast pump to empty your breasts.  · If you go back to work, pump your breasts while at work to stay in time with your nursing schedule.  · Avoid   allowing your breasts to become overly filled with milk (engorged).  SEEK MEDICAL CARE IF:   · You have pus-like discharge from the breast.  · Your symptoms do not improve with the treatment prescribed by your health care provider within 2 days.  SEEK IMMEDIATE MEDICAL CARE IF:   · Your pain and swelling are getting worse.  · You have pain that is not controlled with medicine.  · You have a red line extending from the breast toward your armpit.  · You have a fever or persistent symptoms for more than 2 3 days.  · You have a fever and your symptoms suddenly get worse.  Document Released:  11/27/2005 Document Revised: 09/17/2013 Document Reviewed: 06/27/2013  [employer]® Patient Information ©2014 [employer].

## 2014-01-09 NOTE — ED Notes (Signed)
Patient has a child with her.  Mother stopped nursing 6 months ago.  Mother had a pregnancy since first child was born and the child died during childbirth.  When nursing , right breast was nursed by child, but not left breast, no milk .

## 2014-01-09 NOTE — ED Notes (Signed)
Left breast pain, firmness.  [facility] and [facility] advil and cephalexin.  .  Today, no better at all.

## 2014-01-09 NOTE — ED Notes (Signed)
Alert, NAD, calm, interactive, rates L breast pain 5/10, denies sx other than pain, blood sent o lab, pending [facility] pt given [facility]J per request, VSS.

## 2014-01-09 NOTE — ED Notes (Signed)
The pt has had lt breast pain and swelling for 7 days. She was breast feeding until 6 months ago.  No blood or drainage from the nipple.  lmp now

## 2014-01-09 NOTE — ED Provider Notes (Signed)
CSN: [id number]     Arrival date & time 01/09/14  1649 History   None    Chief Complaint  Patient presents with  . Breast Pain   (Consider location/radiation/quality/duration/timing/severity/associated sxs/prior Treatment) Patient is a 33 y.o. female presenting with female genitourinary complaint. The history is provided by the patient and the spouse.  Female GU Problem This is a new problem. The current episode started more than 2 days ago (left breast pain on sat, seen on mon and started on keflex and [facility], ,here today because sx worse.had problem from left breast with breastfeeding, stopped 95mo ago.). The problem has been gradually worsening. Associated symptoms include chest pain.    Past Medical History  Diagnosis Date  . No pertinent past medical history    Past Surgical History  Procedure Laterality Date  . Cesarean section  2012  . Cesarean section  02/16/2012    Procedure: CESAREAN SECTION;  Surgeon: Tereso NewcomerUgonna A Anyanwu, MD;  Location: [facility];  Service: Gynecology;  Laterality: N/A;  Repair of Ruptured Uterus   Family History  Problem Relation Age of Onset  . Diabetes type II Mother   . Diabetes Mother   . Diabetes type II Father   . Diabetes Father    History  Substance Use Topics  . Smoking status: Never Smoker   . Smokeless tobacco: Never Used  . Alcohol Use: No   OB History   Grav Para Term Preterm Abortions TAB SAB Ect Mult Living   2 2 2       1      Obstetric Comments   Gestational diabetes during first pregnancy, diet-controlled     Review of Systems  Constitutional: Negative.   HENT: Negative.   Respiratory: Negative.   Cardiovascular: Positive for chest pain.    Allergies  Review of patient's allergies indicates no known allergies.  Home Medications   Current Outpatient Rx  Name  Route  Sig  Dispense  Refill  . cephALEXin (KEFLEX) 500 MG capsule   Oral   Take 500 mg by mouth 4 (four) times daily.         . Ibuprofen (ADVIL)  200 MG CAPS   Oral   Take by mouth.         . citalopram (CELEXA) 10 MG tablet   Oral   Take 1 tablet (10 mg total) by mouth daily.   30 tablet   12    BP 98/64  Pulse 112  Temp(Src) 101.9 F (38.8 C) (Oral)  Resp 16  SpO2 98%  LMP 01/09/2014 Physical Exam  Nursing note and vitals reviewed. Constitutional: She appears well-developed and well-nourished.  Pulmonary/Chest: Effort normal and breath sounds normal. She exhibits tenderness. Left breast exhibits mass and tenderness. Breasts are asymmetrical.      ED Course  Procedures (including critical care time) Labs Review Labs Reviewed - No data to display Imaging Review No results found.    MDM  Sent for surgical eval of left breast abscess.    Linna HoffJames D Lilton Pare, MD 01/09/14 1800

## 2014-01-09 NOTE — ED Notes (Signed)
[facility]

## 2014-01-09 NOTE — ED Notes (Addendum)
[facility] No changes. Remains alert, NAD, calm, VSS.

## 2014-01-09 NOTE — ED Notes (Signed)
C/o area to left lateral breast that's red, edematous, firm, warm to touch & fever since Saturday. Denies any drainage or sores. Saw MD Monday & started taking keflex. Reports not getting any better. [facility]

## 2014-01-11 NOTE — ED Provider Notes (Signed)
CSN: [id number]     Arrival date & time 01/09/14  1815 History   First MD Initiated Contact with Patient 01/09/14 1824     Chief Complaint  Patient presents with  . Breast Pain    HPI Patient reports worsening swelling and discomfort in her left breast over the past 7 days.  She previously breast that however she has not been breast-feeding for the past 6 months.  She's noted no discharge from her nipple.  She had a documented fever of 101.9 today.  She was seen in urgent care initially and sent to the emergency department.  She's had no drainage.  She has no history of cancer.  She's had no recent weight loss or weight changes.  She's never had an abscess before.  She's never had this problem before.  Her pain is moderate to severe in severity worse with movement and palpation of her left chest.   Past Medical History  Diagnosis Date  . No pertinent past medical history    Past Surgical History  Procedure Laterality Date  . Cesarean section  2012  . Cesarean section  02/16/2012    Procedure: CESAREAN SECTION;  Surgeon: Tereso Newcomer, MD;  Location: [facility];  Service: Gynecology;  Laterality: N/A;  Repair of Ruptured Uterus   Family History  Problem Relation Age of Onset  . Diabetes type II Mother   . Diabetes Mother   . Diabetes type II Father   . Diabetes Father    History  Substance Use Topics  . Smoking status: Never Smoker   . Smokeless tobacco: Never Used  . Alcohol Use: No   OB History   Grav Para Term Preterm Abortions TAB SAB Ect Mult Living   2 2 2       1      Obstetric Comments   Gestational diabetes during first pregnancy, diet-controlled     Review of Systems  All other systems reviewed and are negative.    Allergies  Review of patient's allergies indicates no known allergies.  Home Medications   Current Outpatient Rx  Name  Route  Sig  Dispense  Refill  . acetaminophen (TYLENOL) 325 MG tablet   Oral   Take 650 mg by mouth every 6 (six) hours as  needed for moderate pain.         . cephALEXin (KEFLEX) 500 MG capsule   Oral   Take 500 mg by mouth 4 (four) times daily.         . Ibuprofen (ADVIL) 200 MG CAPS   Oral   Take 1 capsule by mouth daily.          . Multiple Vitamin (MULTIVITAMIN WITH MINERALS) TABS tablet   Oral   Take 1 tablet by mouth daily.         Marland Kitchen tetrahydrozoline 0.05 % ophthalmic solution   Both Eyes   Place 2 drops into both eyes daily as needed (for dry eyes).         Marland Kitchen ibuprofen (ADVIL,MOTRIN) 600 MG tablet   Oral   Take 1 tablet (600 mg total) by mouth every 8 (eight) hours as needed.   15 tablet   0   . oxyCODONE-acetaminophen (PERCOCET/ROXICET) 5-325 MG per tablet   Oral   Take 1 tablet by mouth every 4 (four) hours as needed for severe pain.   20 tablet   0    BP 99/85  Pulse 104  Temp(Src) 98.7 F (37.1 C) (Oral)  Resp 19  SpO2 99%  LMP 01/09/2014 Physical Exam  Nursing note and vitals reviewed. Constitutional: She is oriented to person, place, and time. She appears well-developed and well-nourished.  HENT:  Head: Normocephalic.  Eyes: EOM are normal.  Neck: Normal range of motion.  Pulmonary/Chest: Effort normal.    Abdominal: She exhibits no distension.  Musculoskeletal: Normal range of motion.  Neurological: She is alert and oriented to person, place, and time.  Psychiatric: She has a normal mood and affect.    ED Course  Procedures (including critical care time) Labs Review Labs Reviewed  CBC WITH DIFFERENTIAL - Abnormal; Notable for the following:    WBC 12.2 (*)    HCT 35.7 (*)    Neutro Abs 9.0 (*)    All other components within normal limits  BASIC METABOLIC PANEL - Abnormal; Notable for the following:    Glucose, Bld 110 (*)    All other components within normal limits   Imaging Review [place] Misc Soft Tissue  01/09/2014   CLINICAL DATA:  Left breast swelling with fever and pain. History breast feeding and tells 6 months prior. Evaluate for abscess.   EXAM: SOFT TISSUE ULTRASOUND - MISCELLANEOUS  TECHNIQUE: Grayscale sonographic imaging of the left breast was performed in the region of interest.  COMPARISON:  None.  FINDINGS: There is diffuse heterogeneity of the lateral left breast, with hypoechoic changes in the interstitial spaces suggesting edema. No focal mass lesion. Where imaged, there is no evidence of abscess or other collection.  IMPRESSION: 1. Nonspecific edematous/inflammatory changes in the left breast. No evidence of abscess. 2. Since inflammatory breast cancer is in the imaging differential diagnosis, close clinical follow-up recommended to ensure complete clearing with therapy.   Electronically Signed   By: Tiburcio PeaJonathan  Watts M.D.   On: 01/09/2014 22:22  I personally reviewed the imaging tests through PACS system I reviewed available ER/hospitalization records through the EMR   EKG Interpretation   None       MDM   1. Mastitis, left, acute    Ultrasound demonstrates no drainable fluid collection.  This was noted both on my on bedside ultrasound as well as confirmed by radiology official ultrasound.  This appears to be a left mastitis versus inflammatory mass.  The patient be treated as possible mastitis with standard mastitis treatment.  The patient [facility].  She'll likely benefit from very close followup for this.  She understands that this could represent cancer and understands the importance of close followup.  All questions were answered.  I had long discussion with both the patient and the patient's spouse.  At this time I do not believe she needs surgical referral.  She understands return to the ER for new or worsening symptoms.    Lyanne CoKevin M Toluwani Yadav, MD 01/11/14 Burna Mortimer0010

## 2014-01-12 ENCOUNTER — Other Ambulatory Visit: Payer: Self-pay | Admitting: Emergency Medicine

## 2014-01-12 ENCOUNTER — Ambulatory Visit
Admission: RE | Admit: 2014-01-12 | Discharge: 2014-01-12 | Disposition: A | Discharge status: Home / Self Care | Payer: BC Managed Care – PPO | Source: Ambulatory Visit | Attending: Family Medicine | Admitting: Family Medicine

## 2014-01-12 ENCOUNTER — Other Ambulatory Visit (HOSPITAL_COMMUNITY): Payer: Self-pay | Admitting: Family Medicine

## 2014-01-12 ENCOUNTER — Ambulatory Visit (INDEPENDENT_AMBULATORY_CARE_PROVIDER_SITE_OTHER): Payer: BC Managed Care – PPO | Admitting: General Surgery

## 2014-01-12 ENCOUNTER — Encounter (INDEPENDENT_AMBULATORY_CARE_PROVIDER_SITE_OTHER): Payer: Self-pay | Admitting: General Surgery

## 2014-01-12 VITALS — BP 112/79 | HR 92 | Temp 97.8°F | Resp 16 | Ht 62.0 in | Wt 132.8 lb

## 2014-01-12 DIAGNOSIS — N611 Abscess of the breast and nipple: Secondary | ICD-10-CM

## 2014-01-12 DIAGNOSIS — N61 Mastitis without abscess: Secondary | ICD-10-CM

## 2014-01-12 HISTORY — DX: Abscess of the breast and nipple: N61.1

## 2014-01-12 NOTE — Progress Notes (Signed)
Patient ID: Brenda Solomon, female   DOB: 03/15/1981, 33 y.o.   MRN: [id number]  Chief Complaint  Patient presents with  . New Evaluation    punch bx for br infection    HPI Brenda Solomon is a 33 y.o. female.  She is referred by Dr. Lanora ManisElizabeth needle at the breast center veins were of for pain swelling and skin thickening of the left breast, rule out inflammatory breast cancer.  The patient's history is more consistent with infection. She says the breast is swollen, painful, warm, and hard for 8-10 days. She was given cephalexin as an antibiotic at the [facility] 7 days ago but she has not gotten any better. She's noticed a little bit of drainage. She is not breast-feeding.  At the [facility] today she had mammograms and ultrasound. The skin of the breast was thickened, especially the areola. No distinct mass or fluid collection. This was felt to be a severe inflammatory process versus inflammatory cancer. An abnormal lymph node was noted in the left axilla. She has had fever as high as 101.9 but no chills or toxicity. No prior history of breast infection.  Any history is negative for breast or ovarian cancer  In March of 2013 she had uterine rupture and fetal loss. Otherwise healthy. No known allergies.  HPI  Past Medical History  Diagnosis Date  . No pertinent past medical history     Past Surgical History  Procedure Laterality Date  . Cesarean section  2012  . Cesarean section  02/16/2012    Procedure: CESAREAN SECTION;  Surgeon: Tereso NewcomerUgonna A Anyanwu, MD;  Location: [facility];  Service: Gynecology;  Laterality: N/A;  Repair of Ruptured Uterus    Family History  Problem Relation Age of Onset  . Diabetes type II Mother   . Diabetes Mother   . Diabetes type II Father   . Diabetes Father     Social History History  Substance Use Topics  . Smoking status: Never Smoker   . Smokeless tobacco: Never Used  . Alcohol Use: No    No Known Allergies  Current Outpatient Prescriptions   Medication Sig Dispense Refill  . acetaminophen (TYLENOL) 325 MG tablet Take 650 mg by mouth every 6 (six) hours as needed for moderate pain.      . cephALEXin (KEFLEX) 500 MG capsule Take 500 mg by mouth 4 (four) times daily.      . Ibuprofen (ADVIL) 200 MG CAPS Take 1 capsule by mouth daily.       Marland Kitchen. ibuprofen (ADVIL,MOTRIN) 600 MG tablet Take 1 tablet (600 mg total) by mouth every 8 (eight) hours as needed.  15 tablet  0  . Multiple Vitamin (MULTIVITAMIN WITH MINERALS) TABS tablet Take 1 tablet by mouth daily.      Marland Kitchen. tetrahydrozoline 0.05 % ophthalmic solution Place 2 drops into both eyes daily as needed (for dry eyes).       No current facility-administered medications for this visit.    Review of Systems Review of Systems  Constitutional: Negative for fever, chills and unexpected weight change.  HENT: Negative for congestion, hearing loss, sore throat, trouble swallowing and voice change.   Eyes: Negative for visual disturbance.  Respiratory: Negative for cough and wheezing.   Cardiovascular: Negative for chest pain, palpitations and leg swelling.  Gastrointestinal: Negative for nausea, vomiting, abdominal pain, diarrhea, constipation, blood in stool, abdominal distention and anal bleeding.  Genitourinary: Negative for hematuria, vaginal bleeding and difficulty urinating.  Musculoskeletal: Negative for arthralgias.  Skin: Positive for color change. Negative for rash and wound.  Neurological: Negative for seizures, syncope and headaches.  Hematological: Negative for adenopathy. Does not bruise/bleed easily.  Psychiatric/Behavioral: Negative for confusion.    Blood pressure 112/79, pulse 92, temperature 97.8 F (36.6 C), temperature source Oral, resp. rate 16, height 5\' 2"  (1.575 m), weight 132 lb 12.8 oz (60.238 kg), last menstrual period 01/09/2014.  Physical Exam Physical Exam  Constitutional: She appears well-developed and well-nourished. She appears distressed.  HENT:   Head: Normocephalic and atraumatic.  Eyes: Conjunctivae and EOM are normal. Pupils are equal, round, and reactive to light. Right eye exhibits no discharge. Left eye exhibits no discharge.  Pulmonary/Chest: Breath sounds normal. No respiratory distress. She has no wheezes.  Left breast is red, warm, swollen. Tender. There is a fluctuant mass of the lateral areola. The skin is edematous. No gross axillary mass. Following sterile prep and 1% Xylocaine with epinephrine local I aspirated the fluctuant mass in the lateral subareolar area and got yellow-tan purulent fluid which was sent for stat Gram stain and culture and sensitivity. I then made a curvilinear incision in the lateral areola and drained a lot of purulent fluid. I broke up some loculations this was very painful. I put an iodoform wick loosely in the wound. Redressed with bulky absorbent dressing.  Skin: She is not diaphoretic.    Data Reviewed Imaging studies and imaging study reports.  Assessment    Left breast abscess and diffuse mastitis. No response to oral cephalexin     Plan    Incision and drainage performed today. I'm not sure whether this will completely drain the abscess or not. Change antibiotics to clindamycin 600 mg 4 times a day x10 days and Cipro 500 mg. twice a day x10 days Prescription for Norco 30 tablets given Wound care discussed Return to office in 2-3 days. If this fails to respond to or deteriorates, this required inpatient management, IV antibodies, and possible exploration in OR.        Angelia Mould. Derrell Lolling, M.D., [facility] General and Minimally invasive Surgery Breast and Colorectal Surgery Office:   [phone] Pager:   [phone]  01/12/2014, 4:38 PM

## 2014-01-12 NOTE — Patient Instructions (Signed)
The pain and swelling in her left breast is most likely due to a bacterial infection.  We drained an abscess from the left breast today and we obtained some of the fluid which will be tested for bacteria  We are changing your antibiotics to Cipro and clindamycin. Please start taking these antibiotics immediately tonight.  You had been given a prescription for Norco for pain  Drink lots and lots of water and fluids  Warm soaks to the left breast 3 times a day  You may shower  Change the bandage as necessary. You may have a lot of drainage and so change the bandage as necessary.  Return to see Dr. Derrell LollingIngram in 2-3 days.

## 2014-01-13 ENCOUNTER — Telehealth (INDEPENDENT_AMBULATORY_CARE_PROVIDER_SITE_OTHER): Payer: Self-pay

## 2014-01-13 NOTE — Telephone Encounter (Signed)
Call in prescription for Phenergan 25 mg tablets. Dispense #20 tablets, no refill. One tablet every 6 hours when necessary for  nausea.  Tell the patient to take half a tablet of clindamycin every 6 hours which would give her a dose of 300 mg every 6 hours. The dose is actually not too high for a severe infection.  hmi

## 2014-01-13 NOTE — Telephone Encounter (Signed)
Patient spouse calling into office to report Brenda Solomon (Patient) has been having nausea and vomiting.  Spouse spoke to pharmacist which told him that patient's Clindamycin 600mg  dose was too high.  Spouse concerned that this could be causing her N/V symptoms.  Patient denies having any fevers, redness or swelling at incision site.  Patient spouse would like to know if our office would call in a prescription for nausea for Brenda Solomon (patient)  ([facility])  Please advise

## 2014-01-15 ENCOUNTER — Ambulatory Visit (INDEPENDENT_AMBULATORY_CARE_PROVIDER_SITE_OTHER): Payer: BC Managed Care – PPO | Admitting: General Surgery

## 2014-01-15 ENCOUNTER — Encounter (INDEPENDENT_AMBULATORY_CARE_PROVIDER_SITE_OTHER): Payer: Self-pay | Admitting: General Surgery

## 2014-01-15 DIAGNOSIS — N61 Mastitis without abscess: Secondary | ICD-10-CM

## 2014-01-15 DIAGNOSIS — N611 Abscess of the breast and nipple: Secondary | ICD-10-CM

## 2014-01-15 LAB — WOUND CULTURE
GRAM STAIN: NONE SEEN
ORGANISM ID, BACTERIA: NO GROWTH

## 2014-01-15 MED ORDER — PROMETHAZINE HCL 50 MG PO TABS: 25.0000 mg | ORAL_TABLET | Freq: Four times a day (QID) | ORAL | Status: DC | PRN

## 2014-01-15 NOTE — Addendum Note (Signed)
Addended by: Ernestene MentionINGRAM, Jervon Ream M on: 01/15/2014 08:33 AM   Modules accepted: Orders

## 2014-01-15 NOTE — Progress Notes (Signed)
Patient ID: Brenda Solomon, female   DOB: 01/12/1981, 33 y.o.   MRN: [id number] History: This patient returns for short term followup for left breast mastitis with abscess. She says the pain is still present but is a little bit better. Drainage has slowed down a lot.No more fever or chills. Staying hydrated. Cultures showed no growth and the Gram stain is negative, which is unusual because she had grossly purulent fluid that I drained. A size she had nausea and vomiting with the hot his clindamycin, and so we cut that back to 300 mg every 6 hours and she is tolerating that and the Cipro well.  Exam: Patient is alert. Much less distress. Smiling sometimes. Husband is with her. Afebrile. Left breast is still a bit swollen and firm and tender but no more fluctuance. The skin looks much healthier. The induration cellulitis are resolving. The open wound has a little bit of serous drainage but no gross purulence. Redressed.  Assessment: Left breast abscess and diffuse bacterial mastitis. Responding to incision and drainage and intensification of oral antibiotics.  Plan: Continue antibiotics until they are all gone Return to see me in 2 weeks, sooner if there are problems Will be to follow her through complete resolution. Consider imaging studies in 6 months or so.   Angelia MouldHaywood M. Derrell LollingIngram, M.D., [facility] General and Minimally invasive Surgery Breast and Colorectal Surgery Office:   [phone] Pager:   [phone]

## 2014-01-15 NOTE — Patient Instructions (Signed)
The infection in your left breast is getting better. The skin looks healthier than the swelling is less.  Continue to take the clindamycin and Cipro antibiotics until they are completely gone, as we discussed in the office today.  You may shower normally.  Return to see Dr. Derrell LollingIngram in 2 weeks. Call sooner if you develop increasing pain or fever.

## 2014-01-22 ENCOUNTER — Ambulatory Visit (INDEPENDENT_AMBULATORY_CARE_PROVIDER_SITE_OTHER): Payer: BC Managed Care – PPO | Admitting: General Surgery

## 2014-01-22 ENCOUNTER — Encounter (INDEPENDENT_AMBULATORY_CARE_PROVIDER_SITE_OTHER): Payer: Self-pay | Admitting: General Surgery

## 2014-01-22 VITALS — BP 98/60 | HR 80 | Temp 99.0°F | Resp 18 | Ht 62.0 in | Wt 128.0 lb

## 2014-01-22 DIAGNOSIS — N611 Abscess of the breast and nipple: Secondary | ICD-10-CM

## 2014-01-22 DIAGNOSIS — N61 Mastitis without abscess: Secondary | ICD-10-CM

## 2014-01-22 NOTE — Progress Notes (Signed)
Patient ID: Brenda Solomon, female   DOB: 10/06/1981, 33 y.o.   MRN: [id number] History:  This patient returns for short term followup for left breast mastitis with abscess. She states that she is continuing to improve. Almost no drainage. Winona LegatoBrest is still firm. Pain is much better.No fever or chills. She seemed pleased with her progress to date. Cultures showed no growth and the Gram stain is negative, which is unusual because she had grossly purulent fluid that I drained. She has completed the Cipro but has another week of clindamycin.  Exam:  Patient is alert. Much less distress. Smiling sometimes. Husband is with her. Afebrile.  Left breast is still a bit swollen and firm Laterally. But it is no longer warm and is not significantly tender. There is no fluctuance or palpable abscess. The small incision in the areolar area shows some granulation tissue but appears to be healing rapidly. The skin centrally and medially is very healthy.. The skin looks much healthier. The induration cellulitis are resolving. Redressed.   Assessment:  Left breast abscess and diffuse bacterial mastitis. Responding to incision and drainage and intensification of oral antibiotics.   Plan:  Continue antibiotics until they are all gone  Return to see me in 3 weeks, sooner if there are problems  Will be to follow her through complete resolution.  Consider imaging studies in 6 months or so.     Angelia MouldHaywood M. Derrell LollingIngram, M.D., [facility]  [facility]  General and Minimally invasive Surgery  Breast and Colorectal Surgery  Office: [phone]

## 2014-01-22 NOTE — Patient Instructions (Signed)
The infection in your left breast is slowly getting better. I did not feel any abscess or fluid that needs to be drained today.  Continue the antibiotics until they are all gone.  Return to see Dr. Derrell LollingIngram in 3 weeks.  Call sooner if the pain or swelling gets worse.

## 2014-02-02 ENCOUNTER — Encounter (HOSPITAL_COMMUNITY): Payer: Self-pay

## 2014-02-02 ENCOUNTER — Inpatient Hospital Stay (HOSPITAL_COMMUNITY)
Admission: AD | Admit: 2014-02-02 | Discharge: 2014-02-13 | DRG: 601 | Disposition: A | Discharge status: Home Health | Payer: BC Managed Care – PPO | Source: Ambulatory Visit | Attending: General Surgery | Admitting: General Surgery

## 2014-02-02 ENCOUNTER — Encounter (INDEPENDENT_AMBULATORY_CARE_PROVIDER_SITE_OTHER): Payer: Self-pay | Admitting: Surgery

## 2014-02-02 ENCOUNTER — Ambulatory Visit (INDEPENDENT_AMBULATORY_CARE_PROVIDER_SITE_OTHER): Payer: BC Managed Care – PPO | Admitting: Surgery

## 2014-02-02 ENCOUNTER — Emergency Department (HOSPITAL_COMMUNITY)
Admission: EM | Admit: 2014-02-02 | Discharge: 2014-02-02 | Disposition: A | Discharge status: Other Acute Inpatient Hospital | Payer: BC Managed Care – PPO

## 2014-02-02 VITALS — BP 110/62 | HR 72 | Temp 98.0°F | Resp 14 | Ht 62.0 in | Wt 133.8 lb

## 2014-02-02 DIAGNOSIS — R197 Diarrhea, unspecified: Secondary | ICD-10-CM | POA: Diagnosis not present

## 2014-02-02 DIAGNOSIS — B009 Herpesviral infection, unspecified: Secondary | ICD-10-CM | POA: Diagnosis present

## 2014-02-02 DIAGNOSIS — N61 Mastitis without abscess: Principal | ICD-10-CM | POA: Diagnosis present

## 2014-02-02 DIAGNOSIS — N611 Abscess of the breast and nipple: Secondary | ICD-10-CM

## 2014-02-02 HISTORY — DX: Gestational diabetes mellitus in pregnancy, unspecified control: O24.419

## 2014-02-02 HISTORY — DX: Abscess of the breast and nipple: N61.1

## 2014-02-02 LAB — BASIC METABOLIC PANEL
BUN: 9 mg/dL (ref 6–23)
CALCIUM: 9 mg/dL (ref 8.4–10.5)
CO2: 23 meq/L (ref 19–32)
Chloride: 97 mEq/L (ref 96–112)
Creatinine, Ser: 0.72 mg/dL (ref 0.50–1.10)
GFR calc Af Amer: >90 mL/min (ref >90)
GLUCOSE: 116 mg/dL — AB (ref 70–99)
POTASSIUM: 3.7 meq/L (ref 3.7–5.3)
SODIUM: 135 meq/L — AB (ref 137–147)

## 2014-02-02 LAB — CBC WITH DIFFERENTIAL/PLATELET
Basophils Absolute: 0 10*3/uL (ref 0.0–0.1)
Basophils Relative: 0 % (ref 0–1)
EOS ABS: 0.4 10*3/uL (ref 0.0–0.7)
Eosinophils Relative: 3 % (ref 0–5)
HCT: 32.9 % — ABNORMAL LOW (ref 36.0–46.0)
HEMOGLOBIN: 11.1 g/dL — AB (ref 12.0–15.0)
LYMPHS ABS: 2 10*3/uL (ref 0.7–4.0)
Lymphocytes Relative: 15 % (ref 12–46)
MCH: 29.1 pg (ref 26.0–34.0)
MCHC: 33.7 g/dL (ref 30.0–36.0)
MCV: 86.1 fL (ref 78.0–100.0)
MONOS PCT: 5 % (ref 3–12)
Monocytes Absolute: 0.7 10*3/uL (ref 0.1–1.0)
Neutro Abs: 10 10*3/uL — ABNORMAL HIGH (ref 1.7–7.7)
Neutrophils Relative %: 77 % (ref 43–77)
Platelets: 290 10*3/uL (ref 150–400)
RBC: 3.82 MIL/uL — ABNORMAL LOW (ref 3.87–5.11)
RDW: 13.8 % (ref 11.5–15.5)
WBC: 13.1 10*3/uL — AB (ref 4.0–10.5)

## 2014-02-02 MED ORDER — MORPHINE SULFATE 4 MG/ML IJ SOLN
4.0000 mg | INTRAMUSCULAR | Status: DC | PRN
  Administered 2014-02-05 – 2014-02-09 (×9): 4 mg via INTRAVENOUS
  Filled 2014-02-02 (×9): qty 1

## 2014-02-02 MED ORDER — PANTOPRAZOLE SODIUM 40 MG IV SOLR
40.0000 mg | Freq: Every day | INTRAVENOUS | Status: DC
  Administered 2014-02-02 – 2014-02-10 (×8): 40 mg via INTRAVENOUS
  Filled 2014-02-02 (×10): qty 40

## 2014-02-02 MED ORDER — VANCOMYCIN HCL 10 G IV SOLR
1250.0000 mg | Freq: Two times a day (BID) | INTRAVENOUS | Status: DC
  Administered 2014-02-02 – 2014-02-13 (×21): 1250 mg via INTRAVENOUS
  Filled 2014-02-02 (×25): qty 1250

## 2014-02-02 MED ORDER — OXYCODONE HCL 5 MG PO TABS
5.0000 mg | ORAL_TABLET | ORAL | Status: DC | PRN
  Administered 2014-02-02 – 2014-02-09 (×8): 5 mg via ORAL
  Filled 2014-02-02 (×8): qty 1

## 2014-02-02 MED ORDER — ONDANSETRON HCL 4 MG/2ML IJ SOLN
4.0000 mg | Freq: Four times a day (QID) | INTRAMUSCULAR | Status: DC | PRN
  Administered 2014-02-05 – 2014-02-12 (×6): 4 mg via INTRAVENOUS
  Filled 2014-02-02 (×6): qty 2

## 2014-02-02 MED ORDER — POTASSIUM CHLORIDE IN NACL 20-0.9 MEQ/L-% IV SOLN
INTRAVENOUS | Status: DC
  Administered 2014-02-02: via INTRAVENOUS
  Filled 2014-02-02 (×3): qty 1000

## 2014-02-02 MED ORDER — PIPERACILLIN-TAZOBACTAM 3.375 G IVPB
3.3750 g | Freq: Three times a day (TID) | INTRAVENOUS | Status: DC
  Administered 2014-02-03 – 2014-02-13 (×31): 3.375 g via INTRAVENOUS
  Filled 2014-02-02 (×34): qty 50

## 2014-02-02 MED ORDER — ENOXAPARIN SODIUM 40 MG/0.4ML ~~LOC~~ SOLN
40.0000 mg | SUBCUTANEOUS | Status: DC
  Administered 2014-02-02 – 2014-02-12 (×10): 40 mg via SUBCUTANEOUS
  Filled 2014-02-02 (×12): qty 0.4

## 2014-02-02 NOTE — ED Notes (Signed)
Staffed called twice for pt, no response.

## 2014-02-02 NOTE — Progress Notes (Signed)
ANTIBIOTIC CONSULT NOTE - INITIAL  Pharmacy Consult for vancomycin  Indication: cellulitis of breast  No Known Allergies  Patient Measurements:   Adjusted Body Weight:   Vital Signs: Temp: 99.6 F (37.6 C) (02/23 2038) Temp src: Oral (02/23 2038) BP: 115/77 mmHg (02/23 2038) Pulse Rate: 104 (02/23 2038) Intake/Output from previous day:   Intake/Output from this shift:    Labs: No results found for this basename: WBC, HGB, PLT, LABCREA, CREATININE,  in the last 72 hours The CrCl is unknown because both a height and weight (above a minimum accepted value) are required for this calculation. No results found for this basename: VANCOTROUGH, Leodis BinetVANCOPEAK, VANCORANDOM, GENTTROUGH, GENTPEAK, GENTRANDOM, TOBRATROUGH, TOBRAPEAK, TOBRARND, AMIKACINPEAK, AMIKACINTROU, AMIKACIN,  in the last 72 hours   Microbiology: Recent Results (from the past 720 hour(s))  WOUND CULTURE     Status: None   Collection Time    01/12/14  4:31 PM      Result Value Ref Range Status   Gram Stain Rare   Final   Gram Stain WBC present-both PMN and Mononuclear   Final   Gram Stain Rare Squamous Epithelial Cells Present   Final   Gram Stain No Organisms Seen   Final   Organism ID, Bacteria NO GROWTH 2 DAYS   Final    Medical History: Past Medical History  Diagnosis Date  . No pertinent past medical history     Assessment: [age] YOF with worsening erythema of breast with abscess.  Earlier in Feb patient underwent I&D in the office. Cultures were unrevealing.  Orders to start vancomycin  2/23 >>vancomycin  >>    Tmax: pending WBCs: pending Renal: pending, SCr on 1/30 = 0.68 for CrCl > 18400ml/min  2/3: wound (breast abscess): NG  Goal of Therapy:  Vancomycin trough level 10-15 mcg/ml  Plan:   Vancomycin 1250mg  IV q12h  Check steady state trough if remains on vancomucin > 4- 5days  Juliette Alcideustin Burgandy Hackworth, PharmD, BCPS.   Pager: [phone]  02/02/2014,9:14 PM

## 2014-02-02 NOTE — Progress Notes (Signed)
Subjective:     Patient ID: Brenda JensenArifa Yun, female   DOB: 06/26/1981, 33 y.o.   MRN: [id number]  HPI See admission H&P. She has worsening erythema of the breast  Review of Systems     Objective:   Physical Exam On examination there is worsening erythema with Cellulitis And abscess    Assessment:     Worsening left breast abscess     Plan:     She'll be admitted to the hospital for definitive care

## 2014-02-02 NOTE — H&P (Signed)
Brenda Solomon is an 33 y.o. female.   Chief Complaint: Left breast abscess HPI: This is a pleasant female who presented to our office and saw Dr. Derrell Solomon earlier this month for severe left breast abscess. He performed an incision and drainage in the office. He saw her back on February 12 and she was improving. Her antibiotics finished. She now presents with worsening pain erythema of the left breast.  Past Medical History  Diagnosis Date  . No pertinent past medical history     Past Surgical History  Procedure Laterality Date  . Cesarean section  2012  . Cesarean section  02/16/2012    Procedure: CESAREAN SECTION;  Surgeon: Tereso NewcomerUgonna A Anyanwu, MD;  Location: [facility];  Service: Gynecology;  Laterality: N/A;  Repair of Ruptured Uterus  . Breast surgery      Family History  Problem Relation Age of Onset  . Diabetes type II Mother   . Diabetes Mother   . Diabetes type II Father   . Diabetes Father    Social History:  reports that she has never smoked. She has never used smokeless tobacco. She reports that she does not drink alcohol or use illicit drugs.  Allergies: No Known Allergies   (Not in a hospital admission)  No results found for this or any previous visit (from the past 48 hour(s)). No results found.  Review of Systems  All other systems reviewed and are negative.    Blood pressure 110/62, pulse 72, temperature 98 F (36.7 C), temperature source Oral, resp. rate 14, height 5\' 2"  (1.575 m), weight 133 lb 12.8 oz (60.691 kg), last menstrual period 01/09/2014. Physical Exam  Constitutional: She is oriented to person, place, and time. She appears well-developed and well-nourished. She appears distressed.  HENT:  Head: Normocephalic and atraumatic.  Right Ear: External ear normal.  Left Ear: External ear normal.  Nose: Nose normal.  Mouth/Throat: Oropharynx is clear and moist.  Eyes: Conjunctivae are normal. Pupils are equal, round, and reactive to light.  Cardiovascular:  Normal rate, regular rhythm, normal heart sounds and intact distal pulses.   Respiratory: Effort normal and breath sounds normal. No respiratory distress. She has no wheezes.  Musculoskeletal: Normal range of motion. She exhibits no edema and no tenderness.  Neurological: She is alert and oriented to person, place, and time.  Skin: She is not diaphoretic. There is erythema.  Psychiatric: Her behavior is normal. Judgment normal.   Breasts: There is extensive erythema of the left breast. There is a second open area on the lateral breast near the open incision and drainage site. There is also an area of fluctuance on the lower inner quadrant of the breast.  Assessment/Plan Worsening left breast abscess and cellulitis  She was also seen in the office by Dr. Derrell Solomon who agrees this is much worse. She needs admission to the hospital for IV antibiotics and incision and drainage with incisional biopsy in the operating room. I discussed this with the patient and her husband who agreed to the admission  Brenda Solomon A 02/02/2014, 4:31 PM

## 2014-02-03 ENCOUNTER — Encounter (HOSPITAL_COMMUNITY): Admission: AD | Disposition: A | Discharge status: Home Health | Payer: Self-pay | Source: Ambulatory Visit

## 2014-02-03 ENCOUNTER — Inpatient Hospital Stay (HOSPITAL_COMMUNITY): Payer: BC Managed Care – PPO | Admitting: Anesthesiology

## 2014-02-03 ENCOUNTER — Encounter (HOSPITAL_COMMUNITY): Payer: Self-pay | Admitting: Certified Registered Nurse Anesthetist

## 2014-02-03 ENCOUNTER — Encounter (HOSPITAL_COMMUNITY): Payer: BC Managed Care – PPO | Admitting: Anesthesiology

## 2014-02-03 DIAGNOSIS — N61 Mastitis without abscess: Secondary | ICD-10-CM

## 2014-02-03 HISTORY — PX: IRRIGATION AND DEBRIDEMENT ABSCESS: SHX5252

## 2014-02-03 LAB — MRSA PCR SCREENING: MRSA by PCR: NEGATIVE

## 2014-02-03 SURGERY — IRRIGATION AND DEBRIDEMENT ABSCESS
Anesthesia: General | Site: Breast | Laterality: Left

## 2014-02-03 MED ORDER — DIPHENHYDRAMINE HCL 25 MG PO CAPS
25.0000 mg | ORAL_CAPSULE | Freq: Four times a day (QID) | ORAL | Status: DC | PRN
  Administered 2014-02-03 – 2014-02-11 (×3): 25 mg via ORAL
  Filled 2014-02-03: qty 1
  Filled 2014-02-03: qty 2

## 2014-02-03 MED ORDER — LIDOCAINE HCL (CARDIAC) 20 MG/ML IV SOLN
INTRAVENOUS | Status: AC
  Filled 2014-02-03: qty 10

## 2014-02-03 MED ORDER — BUPIVACAINE HCL (PF) 0.5 % IJ SOLN
INTRAMUSCULAR | Status: DC | PRN
  Administered 2014-02-03: 19 mL

## 2014-02-03 MED ORDER — LIDOCAINE HCL (CARDIAC) 20 MG/ML IV SOLN
INTRAVENOUS | Status: DC | PRN
  Administered 2014-02-03: 100 mg via INTRAVENOUS

## 2014-02-03 MED ORDER — LACTATED RINGERS IV SOLN
INTRAVENOUS | Status: DC
  Administered 2014-02-03: 1000 mL via INTRAVENOUS

## 2014-02-03 MED ORDER — DIPHENHYDRAMINE HCL 25 MG PO CAPS
ORAL_CAPSULE | ORAL | Status: AC
  Filled 2014-02-03: qty 1

## 2014-02-03 MED ORDER — BUPIVACAINE-EPINEPHRINE PF 0.5-1:200000 % IJ SOLN
INTRAMUSCULAR | Status: AC
  Filled 2014-02-03: qty 30

## 2014-02-03 MED ORDER — FENTANYL CITRATE 0.05 MG/ML IJ SOLN
INTRAMUSCULAR | Status: AC
  Filled 2014-02-03: qty 2

## 2014-02-03 MED ORDER — MIDAZOLAM HCL 5 MG/5ML IJ SOLN
INTRAMUSCULAR | Status: DC | PRN
  Administered 2014-02-03: 1 mg via INTRAVENOUS

## 2014-02-03 MED ORDER — DEXAMETHASONE SODIUM PHOSPHATE 10 MG/ML IJ SOLN
INTRAMUSCULAR | Status: DC | PRN
  Administered 2014-02-03: 10 mg via INTRAVENOUS

## 2014-02-03 MED ORDER — POTASSIUM CHLORIDE IN NACL 20-0.9 MEQ/L-% IV SOLN
INTRAVENOUS | Status: DC
  Administered 2014-02-04 – 2014-02-05 (×4): via INTRAVENOUS
  Administered 2014-02-06: 125 mL via INTRAVENOUS
  Administered 2014-02-06 – 2014-02-10 (×11): via INTRAVENOUS
  Filled 2014-02-03 (×22): qty 1000

## 2014-02-03 MED ORDER — PROPOFOL 10 MG/ML IV BOLUS
INTRAVENOUS | Status: DC | PRN
  Administered 2014-02-03: 170 mg via INTRAVENOUS

## 2014-02-03 MED ORDER — FENTANYL CITRATE 0.05 MG/ML IJ SOLN
INTRAMUSCULAR | Status: DC | PRN
  Administered 2014-02-03: 75 ug via INTRAVENOUS
  Administered 2014-02-03 (×2): 50 ug via INTRAVENOUS
  Administered 2014-02-03: 25 ug via INTRAVENOUS

## 2014-02-03 MED ORDER — HYDROMORPHONE HCL PF 1 MG/ML IJ SOLN
0.5000 mg | INTRAMUSCULAR | Status: DC | PRN
  Administered 2014-02-04: 1 mg via INTRAVENOUS
  Administered 2014-02-04 – 2014-02-06 (×2): 0.5 mg via INTRAVENOUS
  Filled 2014-02-03 (×3): qty 1

## 2014-02-03 MED ORDER — PROPOFOL 10 MG/ML IV BOLUS
INTRAVENOUS | Status: AC
  Filled 2014-02-03: qty 20

## 2014-02-03 MED ORDER — ONDANSETRON HCL 4 MG/2ML IJ SOLN
INTRAMUSCULAR | Status: AC
  Filled 2014-02-03: qty 2

## 2014-02-03 MED ORDER — ONDANSETRON HCL 4 MG/2ML IJ SOLN
INTRAMUSCULAR | Status: DC | PRN
  Administered 2014-02-03: 4 mg via INTRAVENOUS

## 2014-02-03 MED ORDER — HYDROMORPHONE HCL PF 1 MG/ML IJ SOLN
INTRAMUSCULAR | Status: AC
  Filled 2014-02-03: qty 1

## 2014-02-03 MED ORDER — HYDROMORPHONE HCL PF 1 MG/ML IJ SOLN
0.2500 mg | INTRAMUSCULAR | Status: DC | PRN
Start: 2014-02-03 — End: 2014-02-03
  Administered 2014-02-03: 0.5 mg via INTRAVENOUS

## 2014-02-03 MED ORDER — PROMETHAZINE HCL 25 MG/ML IJ SOLN: 6.2500 mg | INTRAMUSCULAR | Status: DC | PRN

## 2014-02-03 MED ORDER — LACTATED RINGERS IV SOLN: INTRAVENOUS | Status: DC

## 2014-02-03 MED ORDER — DEXAMETHASONE SODIUM PHOSPHATE 10 MG/ML IJ SOLN
INTRAMUSCULAR | Status: AC
  Filled 2014-02-03: qty 1

## 2014-02-03 MED ORDER — MIDAZOLAM HCL 2 MG/2ML IJ SOLN
INTRAMUSCULAR | Status: AC
  Filled 2014-02-03: qty 2

## 2014-02-03 SURGICAL SUPPLY — 27 items
BINDER BREAST LRG (GAUZE/BANDAGES/DRESSINGS) ×3 IMPLANT
BLADE HEX COATED 2.75 (ELECTRODE) ×3 IMPLANT
BLADE SURG SZ10 CARB STEEL (BLADE) ×3 IMPLANT
CANISTER SUCTION 2500CC (MISCELLANEOUS) IMPLANT
DECANTER SPIKE VIAL GLASS SM (MISCELLANEOUS) IMPLANT
DERMABOND ADVANCED (GAUZE/BANDAGES/DRESSINGS)
DERMABOND ADVANCED .7 DNX12 (GAUZE/BANDAGES/DRESSINGS) IMPLANT
DRAPE LAPAROTOMY TRNSV 102X78 (DRAPE) IMPLANT
ELECT REM PT RETURN 9FT ADLT (ELECTROSURGICAL) ×3
ELECTRODE REM PT RTRN 9FT ADLT (ELECTROSURGICAL) ×1 IMPLANT
GLOVE BIOGEL PI IND STRL 7.0 (GLOVE) ×1 IMPLANT
GLOVE BIOGEL PI INDICATOR 7.0 (GLOVE) ×2
GOWN STRL REUS W/TWL LRG LVL3 (GOWN DISPOSABLE) ×3 IMPLANT
KIT BASIN OR (CUSTOM PROCEDURE TRAY) ×3 IMPLANT
NEEDLE HYPO 25X1 1.5 SAFETY (NEEDLE) ×3 IMPLANT
NS IRRIG 1000ML POUR BTL (IV SOLUTION) ×3 IMPLANT
PACK BASIC VI WITH GOWN DISP (CUSTOM PROCEDURE TRAY) ×3 IMPLANT
PAD ABD 8X10 STRL (GAUZE/BANDAGES/DRESSINGS) ×3 IMPLANT
PENCIL BUTTON HOLSTER BLD 10FT (ELECTRODE) ×3 IMPLANT
SPONGE GAUZE 4X4 12PLY (GAUZE/BANDAGES/DRESSINGS) ×3 IMPLANT
SPONGE LAP 18X18 X RAY DECT (DISPOSABLE) IMPLANT
STAPLER VISISTAT 35W (STAPLE) IMPLANT
SUT MNCRL AB 4-0 PS2 18 (SUTURE) ×3 IMPLANT
SUT VIC AB 3-0 SH 18 (SUTURE) ×3 IMPLANT
SYR CONTROL 10ML LL (SYRINGE) ×3 IMPLANT
TOWEL OR 17X26 10 PK STRL BLUE (TOWEL DISPOSABLE) ×3 IMPLANT
YANKAUER SUCT BULB TIP 10FT TU (MISCELLANEOUS) ×3 IMPLANT

## 2014-02-03 NOTE — Progress Notes (Signed)
Patient with complaints of itching, no redness,  Hives, or respiratory issues noted.  Patient questions if antibiotics are causing her to itch.  Dr Johna SheriffHoxworth paged per answering service. Orders received

## 2014-02-03 NOTE — Transfer of Care (Signed)
Immediate Anesthesia Transfer of Care Note  Patient: Brenda Solomon  Procedure(s) Performed: Procedure(s): IRRIGATION AND DEBRIDEMENT ABSCESS (Left)  Patient Location: PACU  Anesthesia Type:General  Level of Consciousness: awake and alert   Airway & Oxygen Therapy: Patient Spontanous Breathing and Patient connected to face mask oxygen  Post-op Assessment: Report given to PACU RN and Post -op Vital signs reviewed and stable  Post vital signs: Reviewed and stable  Complications: No apparent anesthesia complications

## 2014-02-03 NOTE — Anesthesia Postprocedure Evaluation (Signed)
Anesthesia Post Note  Patient: Brenda JensenArifa Solomon  Procedure(s) Performed: Procedure(s) (LRB): IRRIGATION AND DEBRIDEMENT ABSCESS (Left)  Anesthesia type: General  Patient location: PACU  Post pain: Pain level controlled  Post assessment: Post-op Vital signs reviewed  Last Vitals:  Filed Vitals:   02/03/14 1415  BP: 92/59  Pulse: 92  Temp:   Resp: 21    Post vital signs: Reviewed  Level of consciousness: sedated  Complications: No apparent anesthesia complications

## 2014-02-03 NOTE — Preoperative (Signed)
Beta Blockers   Reason not to administer Beta Blockers:Not Applicable 

## 2014-02-03 NOTE — Progress Notes (Signed)
ATTENDING ADDENDUM:  I personally reviewed patient's record, examined the patient, and formulated the following assessment and plan:  OR today for drainage and washout.

## 2014-02-03 NOTE — Progress Notes (Signed)
  Subjective: AFebrile. VSS.  Painful left breast with drainage.  She is NPO.    Objective: Vital signs in last 24 hours: Temp:  [98 F (36.7 C)-100.3 F (37.9 C)] 98.3 F (36.8 C) (02/24 0557) Pulse Rate:  [72-104] 101 (02/24 0557) Resp:  [14-20] 18 (02/24 0557) BP: (99-115)/(62-77) 100/69 mmHg (02/24 0557) SpO2:  [97 %-100 %] 98 % (02/24 0557) Weight:  [133 lb 12.8 oz (60.691 kg)] 133 lb 12.8 oz (60.691 kg) (02/23 2210) Last BM Date: 02/01/14  Intake/Output from previous day: 02/23 0701 - 02/24 0700 In: 995 [I.V.:645; IV Piggyback:350] Out: 200 [Urine:200] Intake/Output this shift:   PE General appearance: alert, cooperative and no distress Resp: clear to auscultation bilaterally Breasts: normal appearance, no masses or tenderness, ttp, erythema, induration and fluctuance to left breast  Cardio: regular rate and rhythm, S1, S2 normal, no murmur, click, rub or gallop  Lab Results:   Recent Labs  02/02/14 2150  WBC 13.1*  HGB 11.1*  HCT 32.9*  PLT 290   BMET  Recent Labs  02/02/14 2150  NA 135*  K 3.7  CL 97  CO2 23  GLUCOSE 116*  BUN 9  CREATININE 0.72  CALCIUM 9.0   PT/INR No results found for this basename: LABPROT, INR,  in the last 72 hours ABG No results found for this basename: PHART, PCO2, PO2, HCO3,  in the last 72 hours  Studies/Results: No results found.  Anti-infectives: Anti-infectives   Start     Dose/Rate Route Frequency Ordered Stop   02/02/14 2230  vancomycin (VANCOCIN) 1,250 mg in sodium chloride 0.9 % 250 mL IVPB     1,250 mg 166.7 mL/hr over 90 Minutes Intravenous 2 times daily 02/02/14 2127     02/02/14 2200  piperacillin-tazobactam (ZOSYN) IVPB 3.375 g     3.375 g 12.5 mL/hr over 240 Minutes Intravenous 3 times per day 02/02/14 2057        Assessment/Plan: Abscess of left beast -I&D in OR this AM -Obtain consent.  Risks of surgery discussed with the patient and significant other. -NPO -On Vanc and zosyn -IVF -pain  control -VTE prophylaxis-lovenox SCDs   LOS: 1 day    Brenda Solomon ANP-BC 02/03/2014 7:54 AM

## 2014-02-03 NOTE — Op Note (Signed)
02/02/2014 - 02/03/2014  1:18 PM  PATIENT:  Brenda JensenArifa Solomon  33 y.o. female  Patient Care Team: No Pcp Per Patient as PCP - General (General Practice)  PRE-OPERATIVE DIAGNOSIS:  left breast abcess  POST-OPERATIVE DIAGNOSIS:  left breast abcess  PROCEDURE:  Procedure(s): IRRIGATION AND DEBRIDEMENT ABSCESS  SURGEON:  Surgeon(s): Romie LeveeAlicia Pailyn Bellevue, MD  ASSISTANT: none   ANESTHESIA:   local and general  EBL:  Total I/O In: -  Out: 700 [Urine:700]  DRAINS: none   SPECIMEN:  Source of Specimen:  left breast tissue  DISPOSITION OF SPECIMEN:  pathology and microbiology  COUNTS:  YES  PLAN OF CARE: patient already admitted  PATIENT DISPOSITION:  PACU - hemodynamically stable.  INDICATION: This is a 33 year old female who has had trouble with a left breast abscess for the past few weeks. She has been on several rounds of antibiotics and continues to have recurrence. She was seen in the urgent office by Dr. Magnus IvanBlackman and Dr. Derrell LollingIngram yesterday and felt that an operative debridement was warranted. I have examined her this morning and agree that debridement in the operating room would be beneficial.   OR FINDINGS: Multiloculated left breast abscess confined mostly to the lower lateral quadrant.  DESCRIPTION: the patient was identified in the preoperative holding area and taken to the OR where they were laid Supine on the operating room table.  Gen. anesthesia was induced without difficulty. SCDs were also noted to be in place prior to the initiation of anesthesia.  The patient was then prepped and draped in the usual sterile fashion.    A surgical timeout was performed indicating the correct patient, procedure, positioning and need for preoperative antibiotics.   I began by bluntly dissecting into the abscess cavity with my finger at the area where fluctuance was most palpated. I was able to bluntly dissect inferiorly and superiorly. There were 2 pockets noted on either side. The superior  pocket connected to the previous incision around the areola.  After all pockets were debrided bluntly, hemostasis was achieved with tight packing. After this we irrigated the wound with 2 L of warm normal saline.  After some Marcaine with epinephrine was infused around the abscess for postoperative anesthesia.  A sterile Kerlix fluff was then placed into the wound for packing. A dressing was applied over this as well as a mastectomy binder. The patient was then awakened from anesthesia and sent to the post anesthesia care unit in stable condition. All counts were correct per operating room staff.

## 2014-02-03 NOTE — Progress Notes (Signed)
UR completed. Patient changed to inpatient- requiring IV antibiotics and IVF 

## 2014-02-03 NOTE — Anesthesia Preprocedure Evaluation (Addendum)
Anesthesia Evaluation  Patient identified by MRN, date of birth, ID band Patient awake    Reviewed: Allergy & Precautions, H&P , NPO status , Patient's Chart, lab work & pertinent test results  Airway Mallampati: II TM Distance: >3 FB Neck ROM: Full    Dental  (+) Teeth Intact, Dental Advisory Given   Pulmonary neg pulmonary ROS,  breath sounds clear to auscultation  Pulmonary exam normal       Cardiovascular negative cardio ROS  Rhythm:Regular Rate:Normal     Neuro/Psych negative neurological ROS  negative psych ROS   GI/Hepatic negative GI ROS, Neg liver ROS,   Endo/Other  negative endocrine ROSdiabetes (with pregnancy)  Renal/GU negative Renal ROS  negative genitourinary   Musculoskeletal negative musculoskeletal ROS (+)   Abdominal   Peds  Hematology negative hematology ROS (+)   Anesthesia Other Findings   Reproductive/Obstetrics negative OB ROS                          Anesthesia Physical Anesthesia Plan  ASA: II  Anesthesia Plan: General   Post-op Pain Management:    Induction: Intravenous  Airway Management Planned: LMA  Additional Equipment:   Intra-op Plan:   Post-operative Plan: Extubation in OR  Informed Consent: I have reviewed the patients History and Physical, chart, labs and discussed the procedure including the risks, benefits and alternatives for the proposed anesthesia with the patient or authorized representative who has indicated his/her understanding and acceptance.   Dental advisory given  Plan Discussed with: CRNA  Anesthesia Plan Comments:         Anesthesia Quick Evaluation

## 2014-02-04 ENCOUNTER — Encounter (HOSPITAL_COMMUNITY): Payer: Self-pay | Admitting: General Surgery

## 2014-02-04 LAB — VANCOMYCIN, TROUGH: VANCOMYCIN TR: 15.6 ug/mL (ref 10.0–20.0)

## 2014-02-04 MED ORDER — ACETAMINOPHEN 325 MG PO TABS
650.0000 mg | ORAL_TABLET | Freq: Four times a day (QID) | ORAL | Status: DC | PRN
  Administered 2014-02-04: 650 mg via ORAL
  Filled 2014-02-04: qty 2

## 2014-02-04 MED ORDER — ACETAMINOPHEN 500 MG PO TABS
1000.0000 mg | ORAL_TABLET | Freq: Three times a day (TID) | ORAL | Status: DC
  Administered 2014-02-04 – 2014-02-05 (×5): 1000 mg via ORAL
  Filled 2014-02-04 (×8): qty 2

## 2014-02-04 NOTE — Progress Notes (Signed)
1 Day Post-Op Drainage of breast abscess Subjective: Felt good overnight, more pain this am  Objective: Vital signs in last 24 hours: Temp:  [97.4 F (36.3 C)-98.6 F (37 C)] 97.8 F (36.6 C) (02/25 0555) Pulse Rate:  [81-101] 88 (02/25 0555) Resp:  [16-24] 16 (02/25 0555) BP: (80-103)/(50-64) 86/60 mmHg (02/25 0637) SpO2:  [96 %-100 %] 98 % (02/25 0555)   Intake/Output from previous day: 02/24 0701 - 02/25 0700 In: 2406 [P.O.:600; I.V.:1156; IV Piggyback:650] Out: 3500 [Urine:3500] Intake/Output this shift:     General appearance: alert and cooperative  Incision: packed, induration about the same  Lab Results:   Recent Labs  02/02/14 2150  WBC 13.1*  HGB 11.1*  HCT 32.9*  PLT 290   BMET  Recent Labs  02/02/14 2150  NA 135*  K 3.7  CL 97  CO2 23  GLUCOSE 116*  BUN 9  CREATININE 0.72  CALCIUM 9.0   PT/INR No results found for this basename: LABPROT, INR,  in the last 72 hours ABG No results found for this basename: PHART, PCO2, PO2, HCO3,  in the last 72 hours  MEDS, Scheduled . enoxaparin (LOVENOX) injection  40 mg Subcutaneous Q24H  . pantoprazole (PROTONIX) IV  40 mg Intravenous QHS  . piperacillin-tazobactam (ZOSYN)  IV  3.375 g Intravenous 3 times per day  . vancomycin  1,250 mg Intravenous BID    Studies/Results: No results found.  Assessment: s/p Procedure(s): IRRIGATION AND DEBRIDEMENT ABSCESS Patient Active Problem List   Diagnosis Date Noted  . Left breast abscess 02/02/2014  . Abscess of breast 01/12/2014  . Contraception management 04/15/2012  . Grieving 03/20/2012    Breast abscess  Plan: Continue BSA, cx's pending BID packing changes   LOS: 2 days     .Vanita PandaAlicia C Tayron Hunnell, MD [facility], [place] [phone]   02/04/2014 9:40 AM

## 2014-02-04 NOTE — Progress Notes (Signed)
ANTIBIOTIC CONSULT NOTE - FOLLOW UP  Pharmacy Consult for Vancomycin Indication: cellulitis, L breast abscess  No Known Allergies  Patient Measurements: Height: 5\' 2"  (157.5 cm) Weight: 133 lb 12.8 oz (60.691 kg) IBW/kg (Calculated) : 50.1  Labs:  Recent Labs  02/02/14 2150  WBC 13.1*  HGB 11.1*  PLT 290  CREATININE 0.72   Estimated Creatinine Clearance: 84.1 ml/min (by C-G formula based on Cr of 0.72).  Recent Labs  02/04/14 0900  VANCOTROUGH 15.6     Assessment: 33 YOF with worsening erythema of breast with abscess. Earlier in Feb patient underwent I&D in the office. Cultures were unrevealing. Orders to start vancomycin, zosyn.   2/23 >> vancomycin >>  2/23 >> zosyn >>  Tmax: afeb WBCs: 13.1K on 2/23 Renal: SCr 0.72, CrCl 84 (N >100)  2/3 L breast wound >> NGTD  2/23 L breast abscess >> NGTD   Today is D2 of Vanc and Zosyn (per MD) for breast abscess s/p I&D 2/24.  Vancomycin trough returned as 15.6, higher end of therapeutic range - was originally aiming for higher end of therapeutic range given abscess.   Goal of Therapy:  Vancomycin trough level 10-15 mcg/ml (aiming for higher end)  Plan:   Continue Vancomycin 1250 mg Iv q12h for now  Continue Zosyn as ordered by MD  Pharmacy will f/u  Geoffry Paradisehuyvan Kathee Tumlin, PharmD, BCPS Pager: [phone] 10:02 AM Pharmacy #: [id number]

## 2014-02-05 LAB — CBC
HEMATOCRIT: 29.2 % — AB (ref 36.0–46.0)
Hemoglobin: 9.5 g/dL — ABNORMAL LOW (ref 12.0–15.0)
MCH: 28.3 pg (ref 26.0–34.0)
MCHC: 32.5 g/dL (ref 30.0–36.0)
MCV: 86.9 fL (ref 78.0–100.0)
Platelets: 209 10*3/uL (ref 150–400)
RBC: 3.36 MIL/uL — AB (ref 3.87–5.11)
RDW: 14.1 % (ref 11.5–15.5)
WBC: 11.8 10*3/uL — AB (ref 4.0–10.5)

## 2014-02-05 MED ORDER — PROMETHAZINE HCL 25 MG/ML IJ SOLN
12.5000 mg | Freq: Four times a day (QID) | INTRAMUSCULAR | Status: DC | PRN
  Administered 2014-02-05: 12.5 mg via INTRAVENOUS
  Filled 2014-02-05: qty 1

## 2014-02-05 NOTE — Progress Notes (Signed)
Patient ID: Brenda Solomon, female   DOB: 21-Mar-1981, 33 y.o.   MRN: [id number]   Subjective: Febrile and tachycardic overnight secondary to the abscess.  Did not do well with dressing change last night.  She has not been mobilizing.  She denies, sob, cough or dysuria.    Objective:  Vital signs:  Filed Vitals:   02/05/14 0135 02/05/14 0144 02/05/14 0523 02/05/14 0830  BP:  115/77 110/76   Pulse:  109 115   Temp: 101.1 F (38.4 C) 99.6 F (37.6 C) 103 F (39.4 C) 101.9 F (38.8 C)  TempSrc: Oral Oral Oral   Resp:  18 18   Height:      Weight:      SpO2:  94% 95%     Last BM Date: 02/05/14 (diarrhea)  Intake/Output   Yesterday:  02/25 0701 - 02/26 0700 In: 2476.5 [P.O.:600; I.V.:1876.5] Out: 2475 [Urine:2325; Emesis/NG output:150] This shift:  I/O last 3 completed shifts: In: 3727.5 [P.O.:960; I.V.:2117.5; IV Piggyback:650] Out: 3375 [Urine:3225; Emesis/NG output:150]   Physical Exam: General: Pt awake/alert/oriented x3 in no acute distress Chest: CTANo chest wall pain w good excursion CV:  Pulses intact.  Regular rhythm MS: Normal AROM mjr joints.  No obvious deformity Abdomen: Soft.  Nondistended. nontender  No evidence of peritonitis.  No incarcerated hernias. Ext:  SCDs BLE.  No mjr edema.  No cyanosis Skin: left breast abscess, the wound is clean, smaller opening medially is clean as well.  There is induration around the breast, no fluctuance.     Problem List:   Active Problems:   Left breast abscess    Results:   Labs: Results for orders placed during the hospital encounter of 02/02/14 (from the past 48 hour(s))  MRSA PCR SCREENING     Status: None   Collection Time    02/03/14 10:25 AM      Result Value Ref Range   MRSA by PCR NEGATIVE  NEGATIVE   Comment:            The GeneXpert MRSA Assay (FDA     approved for NASAL specimens     only), is one component of a     comprehensive MRSA colonization     surveillance program. It is not   intended to diagnose MRSA     infection nor to guide or     monitor treatment for     MRSA infections.  CULTURE, ROUTINE-ABSCESS     Status: None   Collection Time    02/03/14 12:57 PM      Result Value Ref Range   Specimen Description ABSCESS LEFT BREAST     Special Requests NONE     Gram Stain       Value: MODERATE WBC PRESENT,BOTH PMN AND MONONUCLEAR     NO SQUAMOUS EPITHELIAL CELLS SEEN     NO ORGANISMS SEEN     Performed at [facility]   Culture       Value: NO GROWTH 1 DAY     Performed at [facility]   Report Status PENDING    VANCOMYCIN, TROUGH     Status: None   Collection Time    02/04/14  9:00 AM      Result Value Ref Range   Vancomycin Tr 15.6  10.0 - 20.0 ug/mL  CULTURE, BLOOD (ROUTINE X 2)     Status: None   Collection Time    02/04/14  4:10 PM      Result  Value Ref Range   Specimen Description Blood     Special Requests Normal     Culture  Setup Time       Value: 02/04/2014 20:31     [facility]   Culture       Value:        BLOOD CULTURE RECEIVED NO GROWTH TO DATE CULTURE WILL BE HELD FOR 5 DAYS BEFORE ISSUING A FINAL NEGATIVE REPORT     Performed at [facility]   Report Status PENDING    CULTURE, BLOOD (ROUTINE X 2)     Status: None   Collection Time    02/04/14  4:16 PM      Result Value Ref Range   Specimen Description Blood     Special Requests Normal     Culture  Setup Time       Value: 02/04/2014 20:31     [facility]formed at [facility]   Culture       Value:        BLOOD CULTURE RECEIVED NO GROWTH TO DATE CULTURE WILL BE HELD FOR 5 DAYS BEFORE ISSUING A FINAL NEGATIVE REPORT     [facility]formed at [facility]   Report Status PENDING    CBC     Status: Abnormal   Collection Time    02/05/14  4:53 AM      Result Value Ref Range   WBC 11.8 (*) 4.0 - 10.5 K/uL   RBC 3.36 (*) 3.87 - 5.11 MIL/uL   Hemoglobin 9.5 (*) 12.0 - 15.0 g/dL   HCT 56.429.2 (*) 33.236.0 - 95.146.0 %   MCV 86.9  78.0 - 100.0 fL    MCH 28.3  26.0 - 34.0 pg   MCHC 32.5  30.0 - 36.0 g/dL   RDW 88.414.1  16.611.5 - 06.315.5 %   Platelets 209  150 - 400 K/uL    Imaging / Studies: No results found.  Medications / Allergies: per chart  Antibiotics: Anti-infectives   Start     Dose/Rate Route Frequency Ordered Stop   02/02/14 2230  vancomycin (VANCOCIN) 1,250 mg in sodium chloride 0.9 % 250 mL IVPB     1,250 mg 166.7 mL/hr over 90 Minutes Intravenous 2 times daily 02/02/14 2127     02/02/14 2200  piperacillin-tazobactam (ZOSYN) IVPB 3.375 g     3.375 g 12.5 mL/hr over 240 Minutes Intravenous 3 times per day 02/02/14 2057        Assessment/Plan:  Abscess of left beast Fevers Continue with BID wet to dry dressing changes, I replaced it this  Blood cultures are negative to date Abscess culture is pending Scheduled tylenol for fever IV hydration  Pain control Ambulate, goal to walk 3 times today VTE prophylaxis-lovenox, SCDs Vancomycin 2/23---> Zosyn---->2/23 Repeat labs in AM  Ashok NorrisEmina Curley [place], [facility]NP-BC [facility] Pager [phone] Office [phone]  02/05/2014 9:40 AM

## 2014-02-05 NOTE — Plan of Care (Signed)
Problem: Phase I Progression Outcomes Goal: Hemodynamically stable Outcome: Completed/Met Date Met:  02/05/14 Afebrile this afternoon.

## 2014-02-05 NOTE — Progress Notes (Signed)
ATTENDING ADDENDUM:  I personally reviewed patient's record, examined the patient, and formulated the following assessment and plan:  Fevers and slight sepsis overnight.  Worse with dressing changes.  Cont Vanc and Zosyn, IVF's.

## 2014-02-06 ENCOUNTER — Encounter (HOSPITAL_COMMUNITY): Payer: Self-pay | Admitting: Surgery

## 2014-02-06 LAB — CLOSTRIDIUM DIFFICILE BY PCR: Toxigenic C. Difficile by PCR: NEGATIVE

## 2014-02-06 LAB — CBC
HEMATOCRIT: 30.4 % — AB (ref 36.0–46.0)
HEMOGLOBIN: 10.1 g/dL — AB (ref 12.0–15.0)
MCH: 28.8 pg (ref 26.0–34.0)
MCHC: 33.2 g/dL (ref 30.0–36.0)
MCV: 86.6 fL (ref 78.0–100.0)
Platelets: 226 10*3/uL (ref 150–400)
RBC: 3.51 MIL/uL — ABNORMAL LOW (ref 3.87–5.11)
RDW: 14.4 % (ref 11.5–15.5)
WBC: 12 10*3/uL — ABNORMAL HIGH (ref 4.0–10.5)

## 2014-02-06 LAB — CULTURE, ROUTINE-ABSCESS
CULTURE: NO GROWTH
Culture: NO GROWTH
GRAM STAIN: NONE SEEN
SPECIAL REQUESTS: NORMAL

## 2014-02-06 MED ORDER — HYDROMORPHONE HCL PF 1 MG/ML IJ SOLN
0.5000 mg | INTRAMUSCULAR | Status: DC | PRN
  Administered 2014-02-06: 1 mg via INTRAVENOUS
  Administered 2014-02-06: 0.5 mg via INTRAVENOUS
  Administered 2014-02-07 – 2014-02-09 (×11): 1 mg via INTRAVENOUS
  Filled 2014-02-06 (×14): qty 1

## 2014-02-06 MED ORDER — FLUCONAZOLE 200 MG PO TABS
200.0000 mg | ORAL_TABLET | Freq: Every day | ORAL | Status: DC
  Administered 2014-02-06 – 2014-02-13 (×7): 200 mg via ORAL
  Filled 2014-02-06 (×9): qty 1

## 2014-02-06 MED ORDER — LORAZEPAM 2 MG/ML IJ SOLN
0.5000 mg | Freq: Four times a day (QID) | INTRAMUSCULAR | Status: DC | PRN
  Administered 2014-02-06 – 2014-02-07 (×2): 0.5 mg via INTRAVENOUS
  Administered 2014-02-09 – 2014-02-10 (×4): 1 mg via INTRAVENOUS
  Filled 2014-02-06 (×7): qty 1

## 2014-02-06 MED ORDER — ACETAMINOPHEN 500 MG PO TABS
1000.0000 mg | ORAL_TABLET | Freq: Four times a day (QID) | ORAL | Status: DC
  Administered 2014-02-06 – 2014-02-13 (×24): 1000 mg via ORAL
  Filled 2014-02-06 (×30): qty 2

## 2014-02-06 NOTE — Progress Notes (Signed)
ATTENDING ADDENDUM:  I personally reviewed patient's record, examined the patient, and formulated the following assessment and plan:  Pt still with systemic symptoms.  Fever curve slightly better.  Will check C diff given diarrhea.  Cont broad spectrum abx, cultures neg so far.

## 2014-02-06 NOTE — Progress Notes (Signed)
Patient ID: Brenda Solomon, female   DOB: 10/12/1981, 33 y.o.   MRN: [id number]   Subjective: Low 02 reading, asked to recheck 94% on room air.  She denies shortness of breath, chest pains.  She is nauseated this morning and vomited x1.  She reports diarrhea over the last 2 days.  Denies abdominal pain, passing flatus.  She remains febrile, tachycardic.  Blood pressure is stable.  Blood cultures are negative to date.  Wound culture with WBC She ambulated several times yesterday without any difficulties.   Objective:  Vital signs:  Filed Vitals:   02/05/14 2230 02/06/14 0503 02/06/14 0600 02/06/14 0821  BP: 117/76  122/83   Pulse: 104  117 109  Temp: 97.7 F (36.5 C)  100.6 F (38.1 C) 102.5 F (39.2 C)  TempSrc: Oral  Oral Oral  Resp: 18  24   Height:      Weight:      SpO2: 99% 99% 88% 94%    Last BM Date: 02/05/14  Intake/Output   Yesterday:  02/26 0701 - 02/27 0700 In: 4825 [P.O.:1700; I.V.:3125] Out: 0  This shift:    I/O last 3 completed shifts: In: 6560 [P.O.:2060; I.V.:4500] Out: 1050 [Urine:900; Emesis/NG output:150]    Physical Exam:  General: Pt awake/alert/oriented x3 in no acute distress  Chest: CTA No chest wall pain w good excursion  CV: Pulses intact. Regular rhythm  MS: Normal AROM mjr joints. No obvious deformity  Abdomen: Soft. Nondistended. nontender No evidence of peritonitis. No incarcerated hernias.  Ext: SCDs BLE. No mjr edema. No cyanosis  Skin: left breast wound is c/d/i   Problem List:   Active Problems:   Left breast abscess    Results:   Labs: Results for orders placed during the hospital encounter of 02/02/14 (from the past 48 hour(s))  VANCOMYCIN, TROUGH     Status: None   Collection Time    02/04/14  9:00 AM      Result Value Ref Range   Vancomycin Tr 15.6  10.0 - 20.0 ug/mL  CULTURE, BLOOD (ROUTINE X 2)     Status: None   Collection Time    02/04/14  4:10 PM      Result Value Ref Range   Specimen Description Blood     Special Requests Normal     Culture  Setup Time       Value: 02/04/2014 20:31     Performed at [facility]   Culture       Value:        BLOOD CULTURE RECEIVED NO GROWTH TO DATE CULTURE WILL BE HELD FOR 5 DAYS BEFORE ISSUING A FINAL NEGATIVE REPORT     Performed at [facility]   Report Status PENDING    CULTURE, BLOOD (ROUTINE X 2)     Status: None   Collection Time    02/04/14  4:16 PM      Result Value Ref Range   Specimen Description Blood     Special Requests Normal     Culture  Setup Time       Value: 02/04/2014 20:31     [facility]formed at [facility]   Culture       Value:        BLOOD CULTURE RECEIVED NO GROWTH TO DATE CULTURE WILL BE HELD FOR 5 DAYS BEFORE ISSUING A FINAL NEGATIVE REPORT     [facility]formed at [facility]   Report Status PENDING    CBC  Status: Abnormal   Collection Time    02/05/14  4:53 AM      Result Value Ref Range   WBC 11.8 (*) 4.0 - 10.5 K/uL   RBC 3.36 (*) 3.87 - 5.11 MIL/uL   Hemoglobin 9.5 (*) 12.0 - 15.0 g/dL   HCT 47.8 (*) 29.5 - 62.1 %   MCV 86.9  78.0 - 100.0 fL   MCH 28.3  26.0 - 34.0 pg   MCHC 32.5  30.0 - 36.0 g/dL   RDW 30.8  65.7 - 84.6 %   Platelets 209  150 - 400 K/uL  CBC     Status: Abnormal   Collection Time    02/06/14  4:57 AM      Result Value Ref Range   WBC 12.0 (*) 4.0 - 10.5 K/uL   RBC 3.51 (*) 3.87 - 5.11 MIL/uL   Hemoglobin 10.1 (*) 12.0 - 15.0 g/dL   HCT 96.2 (*) 95.2 - 84.1 %   MCV 86.6  78.0 - 100.0 fL   MCH 28.8  26.0 - 34.0 pg   MCHC 33.2  30.0 - 36.0 g/dL   RDW 32.4  40.1 - 02.7 %   Platelets 226  150 - 400 K/uL    Imaging / Studies: No results found.  Medications / Allergies: per chart  Antibiotics: Anti-infectives   Start     Dose/Rate Route Frequency Ordered Stop   02/02/14 2230  vancomycin (VANCOCIN) 1,250 mg in sodium chloride 0.9 % 250 mL IVPB     1,250 mg 166.7 mL/hr over 90 Minutes Intravenous 2 times daily 02/02/14 2127     02/02/14 2200   piperacillin-tazobactam (ZOSYN) IVPB 3.375 g     3.375 g 12.5 mL/hr over 240 Minutes Intravenous 3 times per day 02/02/14 2057        Assessment/Plan:  Abscess of left beast  Fevers  Continue with BID wet to dry dressing changes, will evaluate this morning  Blood cultures are negative to date  Abscess culture with WBC, no organism  Scheduled tylenol for fever  IV hydration  Pain control  Ambulate VTE prophylaxis-lovenox, SCDs  Vancomycin 2/23--->  Zosyn---->2/23  Repeat labs in AM  Diarrhea-check stool for c. Diff  Ashok Norris, [facility] Pager [phone] Office [phone]  02/06/2014 8:38 AM

## 2014-02-06 NOTE — Progress Notes (Signed)
Patient ID: Brenda Solomon, female   DOB: 04/09/1981, 33 y.o.   MRN: [id number] 3 Days Post-Op  Subjective: The patient feels better than before her incision and drainage 3 days ago but no improvement over the last couple of days.   Objective: Vital signs in last 24 hours: Temp:  [97.7 F (36.5 C)-102.5 F (39.2 C)] 97.7 F (36.5 C) (02/27 1352) Pulse Rate:  [89-117] 89 (02/27 1352) Resp:  [18-24] 20 (02/27 1352) BP: (104-122)/(71-83) 104/71 mmHg (02/27 1352) SpO2:  [88 %-99 %] 97 % (02/27 1352) Last BM Date: 02/05/14  Intake/Output from previous day: 02/26 0701 - 02/27 0700 In: 4825 [P.O.:1700; I.V.:3125] Out: 0  Intake/Output this shift: Total I/O In: 1060 [P.O.:60; I.V.:1000] Out: -   Breasts: left breast has nearly healed remote I&D site at the areolar border laterally and more recent I&D site in the lateral breast packed open with clean gauze and the lateral and superior breast is without significant induration or erythema. However inferiorly there is induration and tenderness and there are 2 pinpoint areas of thick purulent drainage with superficial fluctuance in the lower inner quadrant and lower outer quadrant likely at the sites of needle aspiration earlier today.  Lab Results:   Recent Labs  02/05/14 0453 02/06/14 0457  WBC 11.8* 12.0*  HGB 9.5* 10.1*  HCT 29.2* 30.4*  PLT 209 226   BMET No results found for this basename: NA, K, CL, CO2, GLUCOSE, BUN, CREATININE, CALCIUM,  in the last 72 hours   Studies/Results: No results found.  Anti-infectives: Anti-infectives   Start     Dose/Rate Route Frequency Ordered Stop   02/06/14 1815  fluconazole (DIFLUCAN) tablet 200 mg     200 mg Oral Daily 02/06/14 1800     02/02/14 2230  vancomycin (VANCOCIN) 1,250 mg in sodium chloride 0.9 % 250 mL IVPB     1,250 mg 166.7 mL/hr over 90 Minutes Intravenous 2 times daily 02/02/14 2127     02/02/14 2200  piperacillin-tazobactam (ZOSYN) IVPB 3.375 g     3.375 g 12.5 mL/hr  over 240 Minutes Intravenous 3 times per day 02/02/14 2057        Assessment/Plan: s/p Procedure(s): IRRIGATION AND DEBRIDEMENT ABSCESS There has been improvement I believe in the lateral breast where her recent I&D was performed but there appears to be some extension at least superficially in the inferior breast with purulent drainage that appears incompletely evacuated. She is afebrile with normal heart rate this evening. I'll plan to observe her overnight and unless there is significant improvement from the areas of pinpoint drainage that she currently has recommended incision and drainage of these 2 sites under general anesthesia tomorrow morning. This was discussed with the patient and her husband and she is in agreement.   LOS: 4 days    Neera Teng T 02/06/2014

## 2014-02-06 NOTE — Care Management Note (Signed)
    Page 1 of 1   02/06/2014     10:53:35 AM   CARE MANAGEMENT NOTE 02/06/2014  Patient:  Brenda Solomon,Brenda Solomon   Account Number:  [id number]  Date Initiated:  02/06/2014  Documentation initiated by:  Lorenda IshiharaPEELE,Lemon Whitacre  Subjective/Objective Assessment:   33 yo female admitted with breast abscess, I&D. PTA lived at home with spouse.     Action/Plan:   Home when stable   Anticipated DC Date:  02/08/2014   Anticipated DC Plan:  HOME W HOME HEALTH SERVICES      DC Planning Services  CM consult      [facility]   Choice offered to / List presented to:  C-1 Patient        HH arranged  HH-1 RN      [facility]  [facility]   Status of service:  Completed, signed off Medicare Important Message given?   (If response is "NO", the following Medicare IM given date fields will be blank) Date Medicare IM given:   Date Additional Medicare IM given:    Discharge Disposition:  HOME W HOME HEALTH SERVICES  Per UR Regulation:  Reviewed for med. necessity/level of care/duration of stay  If discussed at Long Length of Stay Meetings, dates discussed:    Comments:

## 2014-02-07 ENCOUNTER — Encounter (HOSPITAL_COMMUNITY): Payer: BC Managed Care – PPO | Admitting: Anesthesiology

## 2014-02-07 ENCOUNTER — Encounter (HOSPITAL_COMMUNITY): Payer: Self-pay | Admitting: Certified Registered"

## 2014-02-07 ENCOUNTER — Inpatient Hospital Stay (HOSPITAL_COMMUNITY): Payer: BC Managed Care – PPO | Admitting: Anesthesiology

## 2014-02-07 ENCOUNTER — Encounter (HOSPITAL_COMMUNITY): Admission: AD | Disposition: A | Discharge status: Home Health | Payer: Self-pay | Source: Ambulatory Visit

## 2014-02-07 HISTORY — PX: INCISION AND DRAINAGE ABSCESS: SHX5864

## 2014-02-07 LAB — BASIC METABOLIC PANEL
BUN: 5 mg/dL — AB (ref 6–23)
CALCIUM: 8.7 mg/dL (ref 8.4–10.5)
CO2: 26 mEq/L (ref 19–32)
CREATININE: 0.75 mg/dL (ref 0.50–1.10)
Chloride: 102 mEq/L (ref 96–112)
GFR calc Af Amer: >90 mL/min (ref >90)
GFR calc non Af Amer: >90 mL/min (ref >90)
Glucose, Bld: 112 mg/dL — ABNORMAL HIGH (ref 70–99)
Potassium: 4.6 mEq/L (ref 3.7–5.3)
Sodium: 137 mEq/L (ref 137–147)

## 2014-02-07 LAB — CBC
HEMATOCRIT: 30.8 % — AB (ref 36.0–46.0)
Hemoglobin: 10 g/dL — ABNORMAL LOW (ref 12.0–15.0)
MCH: 28.3 pg (ref 26.0–34.0)
MCHC: 32.5 g/dL (ref 30.0–36.0)
MCV: 87.3 fL (ref 78.0–100.0)
PLATELETS: 228 10*3/uL (ref 150–400)
RBC: 3.53 MIL/uL — ABNORMAL LOW (ref 3.87–5.11)
RDW: 14.5 % (ref 11.5–15.5)
WBC: 10.7 10*3/uL — ABNORMAL HIGH (ref 4.0–10.5)

## 2014-02-07 LAB — VANCOMYCIN, TROUGH: Vancomycin Tr: 13.2 ug/mL (ref 10.0–20.0)

## 2014-02-07 SURGERY — INCISION AND DRAINAGE, ABSCESS
Anesthesia: General | Site: Breast | Laterality: Left

## 2014-02-07 MED ORDER — ONDANSETRON HCL 4 MG/2ML IJ SOLN
INTRAMUSCULAR | Status: DC | PRN
  Administered 2014-02-07: 4 mg via INTRAVENOUS

## 2014-02-07 MED ORDER — BUPIVACAINE-EPINEPHRINE 0.25% -1:200000 IJ SOLN
INTRAMUSCULAR | Status: DC | PRN
  Administered 2014-02-07: 20 mL

## 2014-02-07 MED ORDER — LIDOCAINE HCL (CARDIAC) 20 MG/ML IV SOLN
INTRAVENOUS | Status: DC | PRN
  Administered 2014-02-07: 50 mg via INTRAVENOUS

## 2014-02-07 MED ORDER — LACTATED RINGERS IV SOLN
INTRAVENOUS | Status: DC | PRN
  Administered 2014-02-07: 10:00:00 via INTRAVENOUS

## 2014-02-07 MED ORDER — LACTATED RINGERS IV SOLN: INTRAVENOUS | Status: DC

## 2014-02-07 MED ORDER — BUPIVACAINE-EPINEPHRINE PF 0.25-1:200000 % IJ SOLN
INTRAMUSCULAR | Status: AC
  Filled 2014-02-07: qty 30

## 2014-02-07 MED ORDER — LIDOCAINE HCL (CARDIAC) 20 MG/ML IV SOLN
INTRAVENOUS | Status: AC
  Filled 2014-02-07: qty 5

## 2014-02-07 MED ORDER — FENTANYL CITRATE 0.05 MG/ML IJ SOLN
INTRAMUSCULAR | Status: DC | PRN
  Administered 2014-02-07 (×2): 50 ug via INTRAVENOUS

## 2014-02-07 MED ORDER — MIDAZOLAM HCL 2 MG/2ML IJ SOLN
INTRAMUSCULAR | Status: AC
  Filled 2014-02-07: qty 2

## 2014-02-07 MED ORDER — FENTANYL CITRATE 0.05 MG/ML IJ SOLN
INTRAMUSCULAR | Status: AC
  Filled 2014-02-07: qty 2

## 2014-02-07 MED ORDER — PROPOFOL 10 MG/ML IV BOLUS
INTRAVENOUS | Status: DC | PRN
  Administered 2014-02-07: 180 mg via INTRAVENOUS

## 2014-02-07 MED ORDER — BLISTEX MEDICATED EX OINT
TOPICAL_OINTMENT | CUTANEOUS | Status: DC | PRN
  Filled 2014-02-07 (×2): qty 10

## 2014-02-07 MED ORDER — ONDANSETRON HCL 4 MG/2ML IJ SOLN
INTRAMUSCULAR | Status: AC
  Filled 2014-02-07: qty 2

## 2014-02-07 MED ORDER — MIDAZOLAM HCL 5 MG/5ML IJ SOLN
INTRAMUSCULAR | Status: DC | PRN
  Administered 2014-02-07: 2 mg via INTRAVENOUS

## 2014-02-07 MED ORDER — LIP MEDEX EX OINT
TOPICAL_OINTMENT | CUTANEOUS | Status: DC | PRN
  Filled 2014-02-07: qty 7

## 2014-02-07 MED ORDER — PROPOFOL 10 MG/ML IV BOLUS
INTRAVENOUS | Status: AC
Start: 2014-02-07 — End: 2014-02-07
  Filled 2014-02-07: qty 20

## 2014-02-07 SURGICAL SUPPLY — 30 items
BLADE SURG 15 STRL LF DISP TIS (BLADE) ×1 IMPLANT
BLADE SURG 15 STRL SS (BLADE) ×2
BNDG GAUZE ELAST 4 BULKY (GAUZE/BANDAGES/DRESSINGS) IMPLANT
CANISTER SUCTION 2500CC (MISCELLANEOUS) ×3 IMPLANT
COVER SURGICAL LIGHT HANDLE (MISCELLANEOUS) ×3 IMPLANT
DECANTER SPIKE VIAL GLASS SM (MISCELLANEOUS) IMPLANT
DRAPE LAPAROSCOPIC ABDOMINAL (DRAPES) ×3 IMPLANT
ELECT REM PT RETURN 9FT ADLT (ELECTROSURGICAL) ×3
ELECTRODE REM PT RTRN 9FT ADLT (ELECTROSURGICAL) ×1 IMPLANT
GAUZE PACKING IODOFORM 1/2 (PACKING) ×3 IMPLANT
GLOVE BIO SURGEON STRL SZ7.5 (GLOVE) ×3 IMPLANT
GOWN STRL REUS W/TWL LRG LVL3 (GOWN DISPOSABLE) ×6 IMPLANT
KIT BASIN OR (CUSTOM PROCEDURE TRAY) ×3 IMPLANT
NEEDLE HYPO 25X1 1.5 SAFETY (NEEDLE) ×3 IMPLANT
NS IRRIG 1000ML POUR BTL (IV SOLUTION) ×6 IMPLANT
PAD ABD 8X10 STRL (GAUZE/BANDAGES/DRESSINGS) ×3 IMPLANT
PENCIL BUTTON HOLSTER BLD 10FT (ELECTRODE) ×3 IMPLANT
SPONGE GAUZE 4X4 12PLY (GAUZE/BANDAGES/DRESSINGS) ×3 IMPLANT
SPONGE LAP 18X18 X RAY DECT (DISPOSABLE) ×3 IMPLANT
SUT MNCRL AB 4-0 PS2 18 (SUTURE) ×3 IMPLANT
SUT VIC AB 3-0 SH 27 (SUTURE)
SUT VIC AB 3-0 SH 27XBRD (SUTURE) IMPLANT
SWAB COLLECTION DEVICE MRSA (MISCELLANEOUS) IMPLANT
SYR BULB 3OZ (MISCELLANEOUS) ×3 IMPLANT
SYR CONTROL 10ML LL (SYRINGE) ×3 IMPLANT
TAPE CLOTH SURG 6X10 WHT LF (GAUZE/BANDAGES/DRESSINGS) ×3 IMPLANT
TOWEL OR 17X26 10 PK STRL BLUE (TOWEL DISPOSABLE) ×3 IMPLANT
TUBE ANAEROBIC SPECIMEN COL (MISCELLANEOUS) IMPLANT
WATER STERILE IRR 1000ML POUR (IV SOLUTION) IMPLANT
YANKAUER SUCT BULB TIP NO VENT (SUCTIONS) ×3 IMPLANT

## 2014-02-07 NOTE — Anesthesia Postprocedure Evaluation (Signed)
Anesthesia Post Note  Patient: Brenda JensenArifa Solomon  Procedure(s) Performed: Procedure(s) (LRB): INCISION AND DRAINAGE ABSCESS LEFT BREAST (Left)  Anesthesia type: General  Patient location: PACU  Post pain: Pain level controlled  Post assessment: Post-op Vital signs reviewed  Last Vitals:  Filed Vitals:   02/07/14 1115  BP: 104/65  Pulse: 95  Temp: 36.9 C  Resp: 25    Post vital signs: Reviewed  Level of consciousness: sedated  Complications: No apparent anesthesia complications

## 2014-02-07 NOTE — Op Note (Signed)
Preoperative Diagnosis: multiple left breast abscesses  Postoprative Diagnosis: Same  Procedure: Procedure(s): INCISION AND DRAINAGE ABSCESS LEFT BREAST X2   Surgeon: Glenna FellowsHoxworth, Tennile Styles T   Assistants: none  Anesthesia:  General LMA anesthesia  Indications: patient is a 33 year old non-lactating female who presented about 3 weeks ago with an abscess in the lateral left breast near the areolar border. She initially underwent incision and drainage in the office by Dr. Derrell LollingIngram and seemed to initially improve for the first week or 2. She then presented 5 days ago with increasing redness and swelling and pain in the lateral breast and 4 days ago underwent open incision and drainage of the lateral left breast with findings of a good-sized abscess. All cultures have been negative. She has been on broad-spectrum IV antibiotics. She improved somewhat but has had persistent fever and tachycardia and now has developed 2 areas of induration in the lower inner and Lortab her left breast with aspiration last night showing purulence in both areas. I recommended proceeding with further incision and drainage of these 2 areas. This has been discussed in detail with the patient and her husband including the indications for the surgery and alternatives as well as risks of anesthetic complications, bleeding, infection and further need for surgery. They understand and agree to proceed.  Procedure Detail:  Patient was brought to the operating room, placed in the supine position on the operating table, and laryngeal mask general anesthesia induced. She was already on broad-spectrum IV antibiotics. The bandage was removed from the left breast and packing removed from the lateral I&D site. The breast was widely sterilely prepped and draped with Betadine. The patient Timeout was performed and correct procedure and patient verified. Examination of the breast showed that the I&D site in the lateral breast was cleaned without  purulent drainage or necrotic tissue. This communicated medially to the small periareolar incision which was also clean and without drainage. The lateral breast had some minimal induration around the I&D site as would be expected without erythema. The upper breast was entirely normal to exam. The inferior breast specifically in the lower inner and lower outer quadrant showed 2 areas of induration and erythema with some fluctuance and purulent drainage through 2 pinpoint openings from needle aspiration. I made an approximately 2 cm incision in each of these areas. Laterally a large amount of thick purulent material was drained and communicated down to a several centimeter cavity deep in the breast tissue in the lower outer quadrant. There was a tiny area of communication to the previous lateral I&D wound but not enough to allow adequate drainage. This cavity was widely opened and all loculations broken up. Medially I encountered a moderate amount of white purulent material which also communicated down to a several centimeter discrete cavity in the lower inner quadrant.  Both of these areas were thoroughly irrigated and all loculations broken up. I did not feel any other areas of induration in the breast that would indicate further undrained infection. All of these wounds were infiltrated with Marcaine with epinephrine. Hemostasis was obtained with cautery. The wounds were all widely packed open with 1/2 inch iodoform gauze. Dry dressing was applied. Sponge needle instrument counts were correct.  Findings: As above  Estimated Blood Loss:  Minimal         Drains: wounds all packed open as above  Blood Given: none          Specimens: none        Complications:  * No  complications entered in OR log *         Disposition: PACU - hemodynamically stable.         Condition: stable

## 2014-02-07 NOTE — Transfer of Care (Signed)
Immediate Anesthesia Transfer of Care Note  Patient: Brenda JensenArifa Solomon  Procedure(s) Performed: Procedure(s): INCISION AND DRAINAGE ABSCESS LEFT BREAST (Left)  Patient Location: PACU  Anesthesia Type:General  Level of Consciousness: awake, alert  and oriented  Airway & Oxygen Therapy: Patient Spontanous Breathing and Patient connected to face mask oxygen  Post-op Assessment: Report given to PACU RN and Post -op Vital signs reviewed and stable  Post vital signs: Reviewed and stable  Complications: No apparent anesthesia complications

## 2014-02-07 NOTE — Anesthesia Preprocedure Evaluation (Addendum)
Anesthesia Evaluation  Patient identified by MRN, date of birth, ID band Patient awake    Reviewed: Allergy & Precautions, H&P , NPO status , Patient's Chart, lab work & pertinent test results  Airway Mallampati: II TM Distance: >3 FB Neck ROM: Full    Dental  (+) Teeth Intact, Dental Advisory Given   Pulmonary neg pulmonary ROS,  breath sounds clear to auscultation  Pulmonary exam normal       Cardiovascular negative cardio ROS  Rhythm:Regular Rate:Normal     Neuro/Psych negative neurological ROS  negative psych ROS   GI/Hepatic negative GI ROS, Neg liver ROS,   Endo/Other  negative endocrine ROSdiabetes  Renal/GU negative Renal ROS  negative genitourinary   Musculoskeletal negative musculoskeletal ROS (+)   Abdominal   Peds  Hematology negative hematology ROS (+)   Anesthesia Other Findings   Reproductive/Obstetrics negative OB ROS                           Anesthesia Physical Anesthesia Plan  ASA: II  Anesthesia Plan: General   Post-op Pain Management:    Induction: Intravenous  Airway Management Planned: LMA  Additional Equipment:   Intra-op Plan:   Post-operative Plan: Extubation in OR  Informed Consent: I have reviewed the patients History and Physical, chart, labs and discussed the procedure including the risks, benefits and alternatives for the proposed anesthesia with the patient or authorized representative who has indicated his/her understanding and acceptance.   Dental advisory given  Plan Discussed with: CRNA  Anesthesia Plan Comments:         Anesthesia Quick Evaluation

## 2014-02-07 NOTE — Progress Notes (Signed)
Patient ID: Brenda Solomon, female   DOB: 03/29/1981, 33 y.o.   MRN: [id number] 4 Days Post-Op  Subjective: Says breast feels about the same.  Objective: Vital signs in last 24 hours: Temp:  [97.7 F (36.5 C)-102.5 F (39.2 C)] 99.2 F (37.3 C) (02/28 0600) Pulse Rate:  [82-109] 82 (02/28 0600) Resp:  [18-20] 18 (02/28 0600) BP: (101-116)/(70-79) 116/79 mmHg (02/28 0600) SpO2:  [94 %-98 %] 98 % (02/28 0600) Last BM Date: 02/06/14  Intake/Output from previous day: 02/27 0701 - 02/28 0700 In: 3180 [P.O.:180; I.V.:3000] Out: -  Intake/Output this shift:    General appearance: alert, cooperative and no distress Breasts: not much change from yesterday with induration in the lower inner and lower outer quadrants and 2 punctate areas of purulent drainage.  Lab Results:   Recent Labs  02/06/14 0457 02/07/14 0442  WBC 12.0* 10.7*  HGB 10.1* 10.0*  HCT 30.4* 30.8*  PLT 226 228   BMET  Recent Labs  02/07/14 0442  NA 137  K 4.6  CL 102  CO2 26  GLUCOSE 112*  BUN 5*  CREATININE 0.75  CALCIUM 8.7     Studies/Results: No results found.  Anti-infectives: Anti-infectives   Start     Dose/Rate Route Frequency Ordered Stop   02/06/14 1930  fluconazole (DIFLUCAN) tablet 200 mg     200 mg Oral Daily 02/06/14 1800     02/02/14 2230  vancomycin (VANCOCIN) 1,250 mg in sodium chloride 0.9 % 250 mL IVPB     1,250 mg 166.7 mL/hr over 90 Minutes Intravenous 2 times daily 02/02/14 2127     02/02/14 2200  piperacillin-tazobactam (ZOSYN) IVPB 3.375 g     3.375 g 12.5 mL/hr over 240 Minutes Intravenous 3 times per day 02/02/14 2057        Assessment/Plan: s/p Procedure(s): IRRIGATION AND DEBRIDEMENT ABSCESS Seems a little better with less fever, heart rate down, white count improves. However I think she would benefit from further incision and drainage of the 2 areas of induration and purulent drainage. I discussed this with the patient and her husband who want to proceed with  surgery. We discussed the nature of the surgery and where I would place the incisions, they would be left open and packed and that I could not guarantee she would not need further drainage or debridement in the future.   LOS: 5 days    Brenda Solomon 02/07/2014

## 2014-02-07 NOTE — Progress Notes (Signed)
ANTIBIOTIC CONSULT NOTE - FOLLOW UP  Pharmacy Consult for Vancomycin Indication: cellulitis, L breast abscess  No Known Allergies  Patient Measurements: Height: 5\' 2"  (157.5 cm) Weight: 133 lb 12.8 oz (60.691 kg) IBW/kg (Calculated) : 50.1  Labs:  Recent Labs  02/05/14 0453 02/06/14 0457 02/07/14 0442  WBC 11.8* 12.0* 10.7*  HGB 9.5* 10.1* 10.0*  PLT 209 226 228  CREATININE  --   --  0.75   Estimated Creatinine Clearance: 84.1 ml/min (by C-G formula based on Cr of 0.75).  Recent Labs  02/07/14 0937  VANCOTROUGH 13.2     Assessment: 33 YOF with worsening erythema of breast with abscess. Earlier in Feb patient underwent I&D in the office. Cultures were unrevealing. Patient now s/p I&D x2 this admission. Pharmacy has been dosing vancomycin for breast abscess/cellulitis. Noted patient remains on Zosyn per MD.  Antiinfectives 2/23 >> vancomycin >>  2/23 >> zosyn >>  Tmax: Remains febrile when tylenol wears off WBCs: slight decr to 10.7 Renal: SCr 0.75- stable, CrCl 84 (N >100)  Microbiology 2/3 L breast wound: NGF 2/23 L breast abscess: NGF 2/25 blood x 2: NGTD  2/27 C.diff: (-)  Drug level / dose changes info: 2/25 VT @ 0900 = 15.6, continue vanc 1250 mg IV q12h 2/28 VT @ 1000 = 13.2, continue vanc 1250mg  IV q12h   Goal of Therapy:  Vancomycin trough level 10-15 mcg/ml (aiming for higher end)  Plan:   Continue Vancomycin 1250 mg Iv q12h  Continue Zosyn as ordered by MD  Pharmacy will f/u  Thank you for the consult.  Tomi BambergerJesse Andres Bantz, PharmD, BCPS Pager: [phone] Pharmacy: [phone] 02/07/2014 11:19 AM

## 2014-02-08 NOTE — Progress Notes (Signed)
Patient ID: Brenda Solomon, female   DOB: 09/13/1981, 33 y.o.   MRN: [id number] 1 Day Post-Op  Subjective: Still with pain in the left breast but improved after surgery yesterday.  Objective: Vital signs in last 24 hours: Temp:  [97.6 F (36.4 C)-99.1 F (37.3 C)] 99 F (37.2 C) (03/01 0510) Pulse Rate:  [80-102] 102 (03/01 0510) Resp:  [16-29] 18 (03/01 0510) BP: (92-109)/(59-71) 92/62 mmHg (03/01 0510) SpO2:  [94 %-100 %] 94 % (03/01 0510) Last BM Date: 02/06/14  Intake/Output from previous day: 02/28 0701 - 03/01 0700 In: 4420 [P.O.:720; I.V.:3450; IV Piggyback:250] Out: 2400 [Urine:2400] Intake/Output this shift:    General appearance: alert, cooperative and no distress Breasts: improved erythema and induration in the lower breast. All I&D sites with serous sanguinous drainage and packed with iodoform gauze.  Lab Results:   Recent Labs  02/06/14 0457 02/07/14 0442  WBC 12.0* 10.7*  HGB 10.1* 10.0*  HCT 30.4* 30.8*  PLT 226 228   BMET  Recent Labs  02/07/14 0442  NA 137  K 4.6  CL 102  CO2 26  GLUCOSE 112*  BUN 5*  CREATININE 0.75  CALCIUM 8.7     Studies/Results: No results found.  Anti-infectives: Anti-infectives   Start     Dose/Rate Route Frequency Ordered Stop   02/06/14 1930  fluconazole (DIFLUCAN) tablet 200 mg     200 mg Oral Daily 02/06/14 1800     02/02/14 2230  vancomycin (VANCOCIN) 1,250 mg in sodium chloride 0.9 % 250 mL IVPB     1,250 mg 166.7 mL/hr over 90 Minutes Intravenous 2 times daily 02/02/14 2127     02/02/14 2200  piperacillin-tazobactam (ZOSYN) IVPB 3.375 g     3.375 g 12.5 mL/hr over 240 Minutes Intravenous 3 times per day 02/02/14 2057        Assessment/Plan: s/p Procedure(s): INCISION AND DRAINAGE ABSCESS LEFT BREAST Seems improved after repeat I&D of 2 separate areas yesterday. Will need packing removed tomorrow and will likely need IV sedation and pain medication at the bedside. Continue antibiotics today and check  CBC in the morning.    LOS: 6 days    Yazan Gatling T 02/08/2014

## 2014-02-09 ENCOUNTER — Encounter (HOSPITAL_COMMUNITY): Payer: Self-pay | Admitting: General Surgery

## 2014-02-09 DIAGNOSIS — B009 Herpesviral infection, unspecified: Secondary | ICD-10-CM

## 2014-02-09 LAB — CBC
HCT: 28.8 % — ABNORMAL LOW (ref 36.0–46.0)
Hemoglobin: 9.3 g/dL — ABNORMAL LOW (ref 12.0–15.0)
MCH: 28.1 pg (ref 26.0–34.0)
MCHC: 32.3 g/dL (ref 30.0–36.0)
MCV: 87 fL (ref 78.0–100.0)
PLATELETS: 239 10*3/uL (ref 150–400)
RBC: 3.31 MIL/uL — AB (ref 3.87–5.11)
RDW: 14.6 % (ref 11.5–15.5)
WBC: 10 10*3/uL (ref 4.0–10.5)

## 2014-02-09 MED ORDER — VALACYCLOVIR HCL 500 MG PO TABS
1000.0000 mg | ORAL_TABLET | Freq: Two times a day (BID) | ORAL | Status: AC
  Administered 2014-02-09 (×2): 1000 mg via ORAL
  Filled 2014-02-09 (×2): qty 2

## 2014-02-09 MED ORDER — MORPHINE SULFATE 2 MG/ML IJ SOLN
2.0000 mg | Freq: Once | INTRAMUSCULAR | Status: AC
  Administered 2014-02-09: 2 mg via INTRAVENOUS

## 2014-02-09 MED ORDER — MORPHINE SULFATE 2 MG/ML IJ SOLN
2.0000 mg | INTRAMUSCULAR | Status: DC | PRN
  Administered 2014-02-09: 6 mg via INTRAVENOUS
  Administered 2014-02-10: 2 mg via INTRAVENOUS
  Administered 2014-02-10 – 2014-02-11 (×2): 4 mg via INTRAVENOUS
  Filled 2014-02-09: qty 3
  Filled 2014-02-09: qty 1
  Filled 2014-02-09 (×2): qty 2

## 2014-02-09 MED ORDER — MORPHINE SULFATE 2 MG/ML IJ SOLN
INTRAMUSCULAR | Status: AC
  Filled 2014-02-09: qty 1

## 2014-02-09 NOTE — Progress Notes (Signed)
Patient ID: Brenda Solomon, female   DOB: 01/21/1981, 33 y.o.   MRN: [id number]   Subjective: Doing okay.  No fevers. VSS.    Objective:  Vital signs:  Filed Vitals:   02/08/14 1207 02/08/14 1353 02/08/14 2147 02/09/14 0527  BP:  92/63 96/66 113/78  Pulse:  98 92 94  Temp: 98.1 F (36.7 C) 98.1 F (36.7 C) 98 F (36.7 C) 98 F (36.7 C)  TempSrc:  Oral Oral Oral  Resp:  18 18 18   Height:      Weight:      SpO2:  92% 92% 94%    Last BM Date: 02/07/14  Intake/Output   Yesterday:  03/01 0701 - 03/02 0700 In: 3740 [P.O.:740; I.V.:3000] Out: 800 [Urine:800] This shift:    I/O last 3 completed shifts: In: 5125.4 [P.O.:740; I.V.:4385.4] Out: 2400 [Urine:2400]    Physical Exam:  General: Pt awake/alert/oriented x3 in no acute distress  Chest: CTA No chest wall pain w good excursion  CV: Pulses intact. Regular rhythm  MS: Normal AROM mjr joints. No obvious deformity  Abdomen: Soft. Nondistended. nontender No evidence of peritonitis. No incarcerated hernias.  Ext: SCDs BLE. No mjr edema. No cyanosis  Skin: left breast wound is c/d/i   Problem List:   Active Problems:   Abscess of left breast s/p I&D 2/5 & 02/04/2014    Results:   Labs: Results for orders placed during the hospital encounter of 02/02/14 (from the past 48 hour(s))  VANCOMYCIN, TROUGH     Status: None   Collection Time    02/07/14  9:37 AM      Result Value Ref Range   Vancomycin Tr 13.2  10.0 - 20.0 ug/mL  CBC     Status: Abnormal   Collection Time    02/09/14  4:57 AM      Result Value Ref Range   WBC 10.0  4.0 - 10.5 K/uL   RBC 3.31 (*) 3.87 - 5.11 MIL/uL   Hemoglobin 9.3 (*) 12.0 - 15.0 g/dL   HCT 38.728.8 (*) 56.436.0 - 33.246.0 %   MCV 87.0  78.0 - 100.0 fL   MCH 28.1  26.0 - 34.0 pg   MCHC 32.3  30.0 - 36.0 g/dL   RDW 95.114.6  88.411.5 - 16.615.5 %   Platelets 239  150 - 400 K/uL    Imaging / Studies: No results found.  Medications / Allergies: per chart  Antibiotics: Anti-infectives   Start      Dose/Rate Route Frequency Ordered Stop   02/06/14 1930  fluconazole (DIFLUCAN) tablet 200 mg     200 mg Oral Daily 02/06/14 1800     02/02/14 2230  vancomycin (VANCOCIN) 1,250 mg in sodium chloride 0.9 % 250 mL IVPB     1,250 mg 166.7 mL/hr over 90 Minutes Intravenous 2 times daily 02/02/14 2127     02/02/14 2200  piperacillin-tazobactam (ZOSYN) IVPB 3.375 g     3.375 g 12.5 mL/hr over 240 Minutes Intravenous 3 times per day 02/02/14 2057        Assessment/Plan Multiple left breast abscesses S/p I&D 2/24 and 2/28 -start bid wet to dry dressing changes, will premedicate with morphine and 1x dose of ativan. 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

## 2014-02-09 NOTE — Progress Notes (Signed)
General Surgery [facility]  Patient seen and examined.  Family at bedside.  First dressing change done this morning.  WBC now normal.  Pain improved.  Will follow.  Continue IV abx and dressing changes.  Velora Hecklerodd M. Tiara Bartoli, MD, [facility] Office: [phone]

## 2014-02-10 LAB — CULTURE, BLOOD (ROUTINE X 2)
CULTURE: NO GROWTH
Culture: NO GROWTH
SPECIAL REQUESTS: NORMAL
Special Requests: NORMAL

## 2014-02-10 LAB — CBC
HCT: 26.6 % — ABNORMAL LOW (ref 36.0–46.0)
HEMOGLOBIN: 8.7 g/dL — AB (ref 12.0–15.0)
MCH: 28.4 pg (ref 26.0–34.0)
MCHC: 32.7 g/dL (ref 30.0–36.0)
MCV: 86.9 fL (ref 78.0–100.0)
Platelets: 261 10*3/uL (ref 150–400)
RBC: 3.06 MIL/uL — ABNORMAL LOW (ref 3.87–5.11)
RDW: 14.8 % (ref 11.5–15.5)
WBC: 8.7 10*3/uL (ref 4.0–10.5)

## 2014-02-10 LAB — BASIC METABOLIC PANEL
BUN: 6 mg/dL (ref 6–23)
CO2: 22 meq/L (ref 19–32)
Calcium: 8.6 mg/dL (ref 8.4–10.5)
Chloride: 102 mEq/L (ref 96–112)
Creatinine, Ser: 0.82 mg/dL (ref 0.50–1.10)
GFR calc Af Amer: >90 mL/min (ref >90)
GFR calc non Af Amer: >90 mL/min (ref >90)
Glucose, Bld: 140 mg/dL — ABNORMAL HIGH (ref 70–99)
POTASSIUM: 3.8 meq/L (ref 3.7–5.3)
SODIUM: 137 meq/L (ref 137–147)

## 2014-02-10 MED ORDER — OXYCODONE HCL 5 MG PO TABS
10.0000 mg | ORAL_TABLET | ORAL | Status: DC | PRN
  Administered 2014-02-10 – 2014-02-12 (×7): 20 mg via ORAL
  Administered 2014-02-13 (×2): 10 mg via ORAL
  Administered 2014-02-13: 20 mg via ORAL
  Filled 2014-02-10: qty 4
  Filled 2014-02-10: qty 2
  Filled 2014-02-10 (×9): qty 4

## 2014-02-10 MED ORDER — SENNOSIDES-DOCUSATE SODIUM 8.6-50 MG PO TABS
1.0000 | ORAL_TABLET | Freq: Two times a day (BID) | ORAL | Status: DC
  Administered 2014-02-12: 1 via ORAL
  Filled 2014-02-10 (×7): qty 1

## 2014-02-10 NOTE — Progress Notes (Signed)
General Surgery [facility]  Agree.  Continue dressing changes with pain control.  Home when able to tolerate dressing changes.  Continue abx - convert to oral soon.  Velora Hecklerodd M. Lorne Winkels, MD, [facility] Office: [phone]

## 2014-02-10 NOTE — Progress Notes (Signed)
ANTIBIOTIC CONSULT NOTE - FOLLOW UP  Pharmacy Consult for Vancomycin Indication: cellulitis, L breast abscess  No Known Allergies  Patient Measurements: Height: 5\' 2"  (157.5 cm) Weight: 133 lb 12.8 oz (60.691 kg) IBW/kg (Calculated) : 50.1  Labs:  Recent Labs  02/09/14 0457 02/10/14 0438  WBC 10.0 8.7  HGB 9.3* 8.7*  PLT 239 261  CREATININE  --  0.82   Estimated Creatinine Clearance: 82.1 ml/min (by C-G formula based on Cr of 0.82).  Antiinfectives 2/23 >> vancomycin >>  2/23 >> zosyn >>  Microbiology 2/3 L breast wound: NGF 2/23 L breast abscess: NGF 2/25 blood x 2: NGTD  2/27 C.diff: (-)  Assessment: 33 YOF with worsening erythema of breast with abscess. Earlier in Feb patient underwent I&D in the office. Cultures were unrevealing. Patient now s/p I&D x2 this admission. Pharmacy has been dosing vancomycin for breast abscess/cellulitis. Noted patient remains on Zosyn per MD.  Day # 7 Vancomycin and Zosyn  Tmax: 100.6  WBCs: improved to WNL  Renal: SCr 0.82 (3/3), CrCl 82 CG  Most recent vancomycin trough level = 13.2 (02/07/14) on vanc 1250mg  IV q12h   Goal of Therapy:  Vancomycin trough level 10-15 mcg/ml (aiming for higher end)  Plan:   Continue Vancomycin 1250 mg Iv q12h  Continue Zosyn as ordered by MD  Pharmacy will f/u, consider change to PO abx when appropriate per MD notes.  Thank you for the consult.  Lynann Beaverhristine Yamil Oelke PharmD, BCPS Pager [phone] 02/10/2014 11:20 AM

## 2014-02-10 NOTE — Progress Notes (Signed)
Patient ID: Brenda Solomon, female   DOB: Apr 25, 1981, 33 y.o.   MRN: [id number]  Subjective: Febrile 100.6 yesterday.  Did okay with dressing changes.  Had a BM yesterday.    Objective:  Vital signs:  Filed Vitals:   02/09/14 1410 02/09/14 1806 02/09/14 2157 02/10/14 0630  BP: 97/64  116/81 111/76  Pulse: 100  97 79  Temp: 99.1 F (37.3 C) 100.6 F (38.1 C) 98.6 F (37 C) 98.2 F (36.8 C)  TempSrc: Oral Oral Oral Oral  Resp: 18  18 16   Height:      Weight:      SpO2: 95%  97% 98%    Last BM Date: 02/07/14  Intake/Output   Yesterday:  03/02 0701 - 03/03 0700 In: 1497 [P.O.:1200; I.V.:3000; IV Piggyback:2150] Out: 2750 [Urine:2750] This shift:    Physical Exam:  General: Pt awake/alert/oriented x3 in no acute distress  Chest: CTA No chest wall pain w good excursion  CV: Pulses intact. Regular rhythm  MS: Normal AROM mjr joints. No obvious deformity  Abdomen: Soft. Nondistended. nontender No evidence of peritonitis. No incarcerated hernias.  Ext: SCDs BLE. No mjr edema. No cyanosis  Skin: assessed 3/2 there was no surrounding erythema or induration.  Wounds were clean, 4 openings.  Will assess again today.   Problem List:   Active Problems:   Abscess of left breast s/p I&D 2/5 & 02/04/2014    Results:   Labs: Results for orders placed during the hospital encounter of 02/02/14 (from the past 48 hour(s))  CBC     Status: Abnormal   Collection Time    02/09/14  4:57 AM      Result Value Ref Range   WBC 10.0  4.0 - 10.5 K/uL   RBC 3.31 (*) 3.87 - 5.11 MIL/uL   Hemoglobin 9.3 (*) 12.0 - 15.0 g/dL   HCT 28.8 (*) 36.0 - 46.0 %   MCV 87.0  78.0 - 100.0 fL   MCH 28.1  26.0 - 34.0 pg   MCHC 32.3  30.0 - 36.0 g/dL   RDW 14.6  11.5 - 15.5 %   Platelets 239  150 - 400 K/uL  CBC     Status: Abnormal   Collection Time    02/10/14  4:38 AM      Result Value Ref Range   WBC 8.7  4.0 - 10.5 K/uL   RBC 3.06 (*) 3.87 - 5.11 MIL/uL   Hemoglobin 8.7 (*) 12.0 - 15.0 g/dL   HCT 26.6 (*) 36.0 - 46.0 %   MCV 86.9  78.0 - 100.0 fL   MCH 28.4  26.0 - 34.0 pg   MCHC 32.7  30.0 - 36.0 g/dL   RDW 14.8  11.5 - 15.5 %   Platelets 261  150 - 400 K/uL  BASIC METABOLIC PANEL     Status: Abnormal   Collection Time    02/10/14  4:38 AM      Result Value Ref Range   Sodium 137  137 - 147 mEq/L   Potassium 3.8  3.7 - 5.3 mEq/L   Chloride 102  96 - 112 mEq/L   CO2 22  19 - 32 mEq/L   Glucose, Bld 140 (*) 70 - 99 mg/dL   BUN 6  6 - 23 mg/dL   Creatinine, Ser 0.82  0.50 - 1.10 mg/dL   Calcium 8.6  8.4 - 10.5 mg/dL   GFR calc non Af Amer >90  >90 mL/min   GFR calc  Af Amer >90  >90 mL/min   Comment: (NOTE)     The eGFR has been calculated using the CKD EPI equation.     This calculation has not been validated in all clinical situations.     eGFR's persistently <90 mL/min signify possible Chronic Kidney     Disease.    Imaging / Studies: No results found.  Medications / Allergies: per chart  Antibiotics: Anti-infectives   Start     Dose/Rate Route Frequency Ordered Stop   02/09/14 1000  valACYclovir (VALTREX) tablet 1,000 mg     1,000 mg Oral 2 times daily 02/09/14 0830 02/09/14 2223   02/06/14 1930  fluconazole (DIFLUCAN) tablet 200 mg     200 mg Oral Daily 02/06/14 1800     02/02/14 2230  vancomycin (VANCOCIN) 1,250 mg in sodium chloride 0.9 % 250 mL IVPB     1,250 mg 166.7 mL/hr over 90 Minutes Intravenous 2 times daily 02/02/14 2127     02/02/14 2200  piperacillin-tazobactam (ZOSYN) IVPB 3.375 g     3.375 g 12.5 mL/hr over 240 Minutes Intravenous 3 times per day 02/02/14 2057         Assessment/Plan  Multiple left breast abscesses  S/p I&D 2/24 and 2/28  -bid wet to dry dressing changes, will premedicate with morphine and 1x dose of ativan.  -modify pain regimen, utilize PO pain meds and IV if needed with dressing changes, but wean off IV over the next few days. -HH for dressing changes.  -mobilize  -cultures negative to date  Vanc and Zosyn  2/23---->  -senna BID  Erby Pian, [facility] Pager [phone] Office [phone]  02/10/2014 8:38 AM

## 2014-02-10 NOTE — Progress Notes (Signed)
Wounds are clean.  There is no surrounding erythema or additional areas of fluctuance.  Continue with BID wet to dry dressing changes and working on pain control.  Otilia Kareem, ANP-BC

## 2014-02-11 MED ORDER — PANTOPRAZOLE SODIUM 40 MG PO TBEC
40.0000 mg | DELAYED_RELEASE_TABLET | Freq: Every day | ORAL | Status: DC
  Administered 2014-02-11 – 2014-02-12 (×2): 40 mg via ORAL
  Filled 2014-02-11 (×3): qty 1

## 2014-02-11 NOTE — Progress Notes (Signed)
General Surgery [facility]  Patient seen with husband at bedside.  Pain better controlled with dressing changes.  Home when able to tolerate dressing changes with oral medications.  Velora Hecklerodd M. Luma Clopper, MD, [facility] Office: [phone]

## 2014-02-11 NOTE — Progress Notes (Addendum)
p 

## 2014-02-11 NOTE — Progress Notes (Signed)
This patient is receiving Protonix. Based on criteria approved by the Pharmacy and Therapeutics Committee, this medication is being converted to the equivalent oral dose form. These criteria include:   . The patient is eating (either orally or per tube) and/or has been taking other orally administered medications for at least 24 hours.  . This patient has no evidence of active gastrointestinal bleeding or impaired GI absorption (gastrectomy, short bowel, patient on TNA or NPO).   If you have questions about this conversion, please contact the pharmacy department.  Brenda Solomon, Brenda Solomon, [facility] 02/11/2014 1:11 PM

## 2014-02-11 NOTE — Progress Notes (Signed)
Patient ID: Brenda Solomon, female   DOB: 11-29-1981, 33 y.o.   MRN: [id number]  Subjective: She has remained afebrile >24h.  VSS.  Ambulating.  Pain much better with dressing changes.  Objective:  Vital signs:  Filed Vitals:   02/10/14 0630 02/10/14 1333 02/10/14 2140 02/11/14 0542  BP: 111/76 101/66 103/71 102/71  Pulse: 79 75 78 100  Temp: 98.2 F (36.8 C) 97.8 F (36.6 C) 98.9 F (37.2 C) 99 F (37.2 C)  TempSrc: Oral Oral Oral Oral  Resp: 16 16 16 16   Height:      Weight:      SpO2: 98% 96% 98% 98%    Last BM Date: 02/10/14  Intake/Output   Yesterday:  03/03 0701 - 03/04 0700 In: 7253 [P.O.:1560; I.V.:3000; IV Piggyback:600] Out: -  This shift:    I/O last 3 completed shifts: In: 6644 [P.O.:2040; I.V.:5000; IV Piggyback:2750] Out: 750 [Urine:750]    Physical Exam:  General: Pt awake/alert/oriented x3 in no acute distress  Chest: CTA No chest wall pain w good excursion  CV: Pulses intact. Regular rhythm  MS: Normal AROM mjr joints. No obvious deformity  Abdomen: Soft. Nondistended. nontender No evidence of peritonitis. No incarcerated hernias.  Ext: SCDs BLE. No mjr edema. No cyanosis  Skin: assessed 3/3 there was no surrounding erythema or induration. Wounds were clean, 4 openings. Will assess again today.    Problem List:   Active Problems:   Abscess of left breast s/p I&D 2/5 & 02/04/2014    Results:   Labs: Results for orders placed during the hospital encounter of 02/02/14 (from the past 48 hour(s))  CBC     Status: Abnormal   Collection Time    02/10/14  4:38 AM      Result Value Ref Range   WBC 8.7  4.0 - 10.5 K/uL   RBC 3.06 (*) 3.87 - 5.11 MIL/uL   Hemoglobin 8.7 (*) 12.0 - 15.0 g/dL   HCT 26.6 (*) 36.0 - 46.0 %   MCV 86.9  78.0 - 100.0 fL   MCH 28.4  26.0 - 34.0 pg   MCHC 32.7  30.0 - 36.0 g/dL   RDW 14.8  11.5 - 15.5 %   Platelets 261  150 - 400 K/uL  BASIC METABOLIC PANEL     Status: Abnormal   Collection Time    02/10/14  4:38 AM       Result Value Ref Range   Sodium 137  137 - 147 mEq/L   Potassium 3.8  3.7 - 5.3 mEq/L   Chloride 102  96 - 112 mEq/L   CO2 22  19 - 32 mEq/L   Glucose, Bld 140 (*) 70 - 99 mg/dL   BUN 6  6 - 23 mg/dL   Creatinine, Ser 0.82  0.50 - 1.10 mg/dL   Calcium 8.6  8.4 - 10.5 mg/dL   GFR calc non Af Amer >90  >90 mL/min   GFR calc Af Amer >90  >90 mL/min   Comment: (NOTE)     The eGFR has been calculated using the CKD EPI equation.     This calculation has not been validated in all clinical situations.     eGFR's persistently <90 mL/min signify possible Chronic Kidney     Disease.    Imaging / Studies: No results found.  Medications / Allergies: per chart  Antibiotics: Anti-infectives   Start     Dose/Rate Route Frequency Ordered Stop   02/09/14 1000  valACYclovir (VALTREX)  tablet 1,000 mg     1,000 mg Oral 2 times daily 02/09/14 0830 02/09/14 2223   02/06/14 1930  fluconazole (DIFLUCAN) tablet 200 mg     200 mg Oral Daily 02/06/14 1800     02/02/14 2230  vancomycin (VANCOCIN) 1,250 mg in sodium chloride 0.9 % 250 mL IVPB     1,250 mg 166.7 mL/hr over 90 Minutes Intravenous 2 times daily 02/02/14 2127     02/02/14 2200  piperacillin-tazobactam (ZOSYN) IVPB 3.375 g     3.375 g 12.5 mL/hr over 240 Minutes Intravenous 3 times per day 02/02/14 2057        Assessment/Plan  Multiple left breast abscesses  S/p I&D 2/24 and 2/28  -bid wet to dry dressing changes -improved pain control with oxycodone.   -HH for dressing changes.  -mobilize  -cultures negative to date  Vanc and Zosyn 2/23---->  -senna BID -anticipate discharge 1-2 days  Erby Pian, [facility] Pager [phone] Office [phone]  02/11/2014 8:25 AM

## 2014-02-12 NOTE — Progress Notes (Signed)
General Surgery [facility]  Agree with ongoing wound care and plans for discharge.  Velora Hecklerodd M. Willetta York, MD, [facility] Office: [phone]

## 2014-02-12 NOTE — Progress Notes (Signed)
Patient ID: Brenda Solomon, female   DOB: 08/04/1981, 33 y.o.   MRN: [id number]  Subjective: Remains afebrile.  Pain control with dressing changes remains an issue.  She is not allowing nursing to pack correctly.    Objective:  Vital signs:  Filed Vitals:   02/11/14 1025 02/11/14 1400 02/11/14 2139 02/12/14 0529  BP: 111/71 103/69 123/84 89/61  Pulse:  83 85 75  Temp:  98.4 F (36.9 C) 99 F (37.2 C) 98.7 F (37.1 C)  TempSrc:  Oral Oral Oral  Resp:  18 18 18   Height:      Weight:      SpO2:  96% 97% 97%    Last BM Date: 02/10/14  Intake/Output   Yesterday:  03/04 0701 - 03/05 0700 In: 1095 [P.O.:720; I.V.:375] Out: 0  This shift:    I/O last 3 completed shifts: In: 3965 [P.O.:1440; I.V.:1875; IV Piggyback:650] Out: 0     Physical Exam:  General: Pt awake/alert/oriented x3 in no acute distress  Abdomen: Soft. Nondistended. nontender No evidence of peritonitis. No incarcerated hernias.  Ext: SCDs BLE. No mjr edema. No cyanosis  Skin: 4 wounds.  Left lateral breast wound with surrounding induration, no erythema or areas of fluctuance.  Wounds are clean, no surrounding erythema.      Problem List:   Active Problems:   Abscess of left breast s/p I&D 2/5 & 02/04/2014    Results:   Labs: No results found for this or any previous visit (from the past 48 hour(s)).  Imaging / Studies: No results found.  Medications / Allergies: per chart  Antibiotics: Anti-infectives   Start     Dose/Rate Route Frequency Ordered Stop   02/09/14 1000  valACYclovir (VALTREX) tablet 1,000 mg     1,000 mg Oral 2 times daily 02/09/14 0830 02/09/14 2223   02/06/14 1930  fluconazole (DIFLUCAN) tablet 200 mg     200 mg Oral Daily 02/06/14 1800     02/02/14 2230  vancomycin (VANCOCIN) 1,250 mg in sodium chloride 0.9 % 250 mL IVPB     1,250 mg 166.7 mL/hr over 90 Minutes Intravenous 2 times daily 02/02/14 2127     02/02/14 2200  piperacillin-tazobactam (ZOSYN) IVPB 3.375 g     3.375  g 12.5 mL/hr over 240 Minutes Intravenous 3 times per day 02/02/14 2057        Assessment/Plan  Multiple left breast abscesses  S/p I&D 2/24 and 2/28  -bid wet to dry dressing changes  -pain control  -HH for dressing changes.  -mobilize  -cultures negative to date  Vanc and Zosyn 2/23---->  -senna BID  -discharge when she is able to tolerate dressing with oral pain meds  Ashok NorrisEmina Vallery Mcdade, [facility] CareNP-BC [facility] Pager [phone] Office [phone]  02/12/2014 10:48 AM

## 2014-02-13 MED ORDER — SULFAMETHOXAZOLE-TMP DS 800-160 MG PO TABS: 1.0000 | ORAL_TABLET | Freq: Two times a day (BID) | ORAL | Status: DC

## 2014-02-13 MED ORDER — OXYCODONE HCL 20 MG PO TABS: 10.0000 mg | ORAL_TABLET | ORAL | Status: DC | PRN

## 2014-02-13 MED ORDER — ONDANSETRON HCL 4 MG PO TABS: 4.0000 mg | ORAL_TABLET | Freq: Three times a day (TID) | ORAL | Status: DC | PRN

## 2014-02-13 NOTE — Discharge Summary (Signed)
Physician Discharge Summary  Brenda Solomon RUE:[id number] DOB: 03-19-1981 DOA: 02/02/2014  PCP: No PCP Per Patient  Consultation: none  Admit date: 02/02/2014 Discharge date: 02/13/2014  Recommendations for Outpatient Follow-up:   Follow-up Information   Follow up with HOXWORTH,BENJAMIN T, MD. Call in 1 week.   Specialty:  General Surgery   Contact information:   [address] [place] [address] [phone]      Discharge Diagnoses:  1. Multiple left breast abscesses   Surgical Procedure: incision and drainage of multiple left breast abscesses 2/24 and 2/28  Discharge Condition: stable Disposition: home  Diet recommendation: regular  Filed Weights   02/02/14 2210  Weight: 133 lb 12.8 oz (60.691 kg)     Filed Vitals:   02/13/14 0526  BP: 112/75  Pulse: 81  Temp: 98 F (36.7 C)  Resp: 16     Hospital Course:  Brenda Solomon was referred from the urgent clinic with worsening left breast abscess following a bedside I&D.  She was admitted for IV antibiotics and additional I&D in the OR.  She had ongoing fevers and underwent additional I&D on 2/28.  Blood cultures were negative.  Abscess culture with WBC, no organisms were identified.  Her fevers improved and white count trended down.  She had tachycardia which improved with volume.  We had a difficult time with dressing changes and managing her pain.  On HD#11 the patient was tolerating the dressing changes, wounds were clean, home health was established and teaching was completed.  She was therefore felt stable for discharge.  She is to follow up with Dr. Johna Sheriff per Dr. Gerrit Friends.  We discussed warning signs that warrant further evaluation.  She was encouraged to call with questions or concerns.  Physical Exam: General appearance: alert and oriented. Calm and cooperative No acute distress. VSS. Afebrile.  Resp: clear to auscultation bilaterally  Cardio: S1S1 RRR without murmurs or gallops. No edema. GI:  soft round and nontender. +BS x4 quadrants. No organomegaly, hernias or masses.  Pulses: +2 bilateral distal pulses without cyanosis  Breast: 4 wounds are clean, there is some induration to the lateral incision without erythema or areas of fluctuance.  This was evaluated by Dr. Gerrit Friends at day of discharge    Discharge Instructions     Medication List         acetaminophen 325 MG tablet  Commonly known as:  TYLENOL  Take 650 mg by mouth every 6 (six) hours as needed for moderate pain.     ibuprofen 200 MG tablet  Commonly known as:  ADVIL,MOTRIN  Take 400 mg by mouth every 6 (six) hours as needed (pain).     Oxycodone HCl 20 MG Tabs  Take 0.5-1 tablets (10-20 mg total) by mouth every 4 (four) hours as needed for moderate pain or severe pain.     sulfamethoxazole-trimethoprim 800-160 MG per tablet  Commonly known as:  BACTRIM DS  Take 1 tablet by mouth 2 (two) times daily.     tetrahydrozoline 0.05 % ophthalmic solution  Place 1 drop into both eyes as needed (dry eyes).           Follow-up Information   Follow up with HOXWORTH,BENJAMIN T, MD. Call in 1 week.   Specialty:  General Surgery   Contact information:   [address] [place] [address] [phone]        The results of significant diagnostics from this hospitalization (including imaging, microbiology, ancillary and laboratory) are  listed below for reference.    Significant Diagnostic Studies: No results found.  Microbiology: Recent Results (from the past 240 hour(s))  MRSA PCR SCREENING     Status: None   Collection Time    02/03/14 10:25 AM      Result Value Ref Range Status   MRSA by PCR NEGATIVE  NEGATIVE Final   Comment:            The GeneXpert MRSA Assay (FDA     approved for NASAL specimens     only), is one component of a     comprehensive MRSA colonization     surveillance program. It is not     intended to diagnose MRSA     infection nor to guide or     monitor  treatment for     MRSA infections.  CULTURE, ROUTINE-ABSCESS     Status: None   Collection Time    02/03/14 12:57 PM      Result Value Ref Range Status   Specimen Description ABSCESS LEFT BREAST   Final   Special Requests NONE   Final   Gram Stain     Final   Value: MODERATE WBC PRESENT,BOTH PMN AND MONONUCLEAR     NO SQUAMOUS EPITHELIAL CELLS SEEN     NO ORGANISMS SEEN     Performed at [facility]   Culture     Final   Value: NO GROWTH 3 DAYS     Performed at [facility]   Report Status 02/06/2014 FINAL   Final  CULTURE, BLOOD (ROUTINE X 2)     Status: None   Collection Time    02/04/14  4:10 PM      Result Value Ref Range Status   Specimen Description Blood   Final   Special Requests Normal   Final   Culture  Setup Time     Final   Value: 02/04/2014 20:31     Performed at [facility]   Culture     Final   Value: NO GROWTH 5 DAYS     Performed at [facility]   Report Status 02/10/2014 FINAL   Final  CULTURE, BLOOD (ROUTINE X 2)     Status: None   Collection Time    02/04/14  4:16 PM      Result Value Ref Range Status   Specimen Description Blood   Final   Special Requests Normal   Final   Culture  Setup Time     Final   Value: 02/04/2014 20:31     Performed at [facility]   Culture     Final   Value: NO GROWTH 5 DAYS     Performed at [facility]   Report Status 02/10/2014 FINAL   Final  CLOSTRIDIUM DIFFICILE BY PCR     Status: None   Collection Time    02/06/14 11:16 AM      Result Value Ref Range Status   C difficile by pcr NEGATIVE  NEGATIVE Final   Comment: Performed at [facility]     Labs: Basic Metabolic Panel:  Recent Labs Lab 02/07/14 0442 02/10/14 0438  NA 137 137  K 4.6 3.8  CL 102 102  CO2 26 22  GLUCOSE 112* 140*  BUN 5* 6  CREATININE 0.75 0.82  CALCIUM 8.7 8.6   Liver Function Tests: No results found for this basename: AST, ALT, ALKPHOS, BILITOT, PROT, ALBUMIN,  in the last  168 hours No results found for this basename: LIPASE, AMYLASE,  in the last 168 hours No results found for this basename: AMMONIA,  in the last 168 hours CBC:  Recent Labs Lab 02/07/14 0442 02/09/14 0457 02/10/14 0438  WBC 10.7* 10.0 8.7  HGB 10.0* 9.3* 8.7*  HCT 30.8* 28.8* 26.6*  MCV 87.3 87.0 86.9  PLT 228 239 261   Cardiac Enzymes: No results found for this basename: CKTOTAL, CKMB, CKMBINDEX, TROPONINI,  in the last 168 hours BNP: BNP (last 3 results) No results found for this basename: PROBNP,  in the last 8760 hours CBG: No results found for this basename: GLUCAP,  in the last 168 hours  Active Problems:   Abscess of left breast s/p I&D 2/5 & 02/04/2014   Signed:  Anette Riedel Julienne Vogler, ANP-BC

## 2014-02-13 NOTE — Discharge Summary (Signed)
General Surgery [facility]  Patient seen and examined this morning.  Examined open wounds and made recommendations for ongoing wound care.  Met with husband and discussed wound care recommendations with him.  Agree with plans for discharge home today with HHN.  Follow up at [place] office with Dr. Excell Seltzer.  Earnstine Regal, MD, [facility] Office: [phone]

## 2014-02-13 NOTE — Discharge Instructions (Signed)
WOUND CARE  It is important that the wound be kept open.   -Keeping the skin edges apart will allow the wound to gradually heal from the base upwards.   - If the skin edges of the wound close too early, a new fluid pocket can form and infection can occur. -This is the reason to pack deeper wounds with gauze or ribbon -This is why drained wounds cannot be sewed closed right away  A healthy wound should form a lining of bright red "beefy" granulating tissue that will help shrink the wound and help the edges grow new skin into it.   -A little mucus / yellow discharge is normal (the body's natural way to try and form a scab) and should be gently washed off with soap and water with daily dressing changes.  -Green or foul smelling drainage implies bacterial colonization and can slow wound healing - a short course of antibiotic ointment (3-5 days) can help it clear up.  Call the doctor if it does not improve or worsens  -Avoid use of antibiotic ointments for more than a week as they can slow wound healing over time.    -Sometimes other wound care products will be used to reduce need for dressing changes and/or help clean up dirty wounds -Sometimes the surgeon needs to debride the wound in the office to remove dead or infected tissue out of the wound so it can heal more quickly and safely.    Change the dressing at least once a day -Wash the wound with mild soap and water gently every day.  It is good to shower or bathe the wound to help it clean out. -Use clean 4x4 gauze to pack the wound(it does not need to be sterile, just clean) -Keep the raw wound moist with a little saline or [place] (saline) gel on the gauze.  -A dry wound will take longer to heal.  -Keep the skin dry around the wound to prevent breakdown and irritation. -Pack the wound down to the base -The goal is to keep the skin apart, not overpack the wound -Use a Q-tip or blunt-tipped kabob stick toothpick to push the gauze down to the base in  narrow or deep wounds   -Cover with a clean gauze and tape -paper or Medipore tape tend to be gentle on the skin -rotate the orientation of the tape to avoid repeated stress/trauma on the skin -using an ACE or Coban wrap on wounds on arms or legs can be used instead.  Complete all antibiotics through the entire prescription to help the infection heal and prevent new places of infection   Returning the see the surgeon is helpful to follow the healing process and help the wound close as fast as possible.

## 2014-02-16 ENCOUNTER — Telehealth (INDEPENDENT_AMBULATORY_CARE_PROVIDER_SITE_OTHER): Payer: Self-pay

## 2014-02-16 NOTE — Telephone Encounter (Signed)
Called and spoke to patient to give post op appointment for 02/19/14 @ 12:00 w/Dr. Johna SheriffHoxworth.

## 2014-02-16 NOTE — Telephone Encounter (Signed)
Message copied by Maryan PulsMOORE, Arron Mcnaught on Mon Feb 16, 2014 11:42 AM ------      Message from: Louie CasaBARAJAS, RUTH      Created: Fri Feb 13, 2014  1:37 PM      Regarding: Dr. Nancy NordmannHoxworth/1st po appt      Contact: [phone]       Patient had Abscess lt breast surgery with Dr. Derrell LollingIngram on 02/07/14 and needs 1st po appointment, please could you schedule.            Thank you. ------

## 2014-02-17 ENCOUNTER — Telehealth (INDEPENDENT_AMBULATORY_CARE_PROVIDER_SITE_OTHER): Payer: Self-pay | Admitting: General Surgery

## 2014-02-17 ENCOUNTER — Encounter (INDEPENDENT_AMBULATORY_CARE_PROVIDER_SITE_OTHER): Payer: BC Managed Care – PPO | Admitting: General Surgery

## 2014-02-17 NOTE — Telephone Encounter (Signed)
Caitlyn, nurse with [facility], called to report some resistance to her coming out to monitor wound care.  Nurse was delayed to come on the weekend instead.  Pt states husband is changing the dressing twice a day.  Nursing has been asked to teach dressings and wound care as well as monitor the wound.

## 2014-02-19 ENCOUNTER — Encounter (INDEPENDENT_AMBULATORY_CARE_PROVIDER_SITE_OTHER): Payer: Self-pay | Admitting: General Surgery

## 2014-02-19 ENCOUNTER — Ambulatory Visit (INDEPENDENT_AMBULATORY_CARE_PROVIDER_SITE_OTHER): Payer: BC Managed Care – PPO | Admitting: General Surgery

## 2014-02-19 VITALS — BP 122/80 | HR 68 | Temp 98.3°F | Resp 16 | Ht 62.0 in | Wt 124.0 lb

## 2014-02-19 DIAGNOSIS — N61 Mastitis without abscess: Secondary | ICD-10-CM

## 2014-02-19 DIAGNOSIS — N611 Abscess of the breast and nipple: Secondary | ICD-10-CM

## 2014-02-19 NOTE — Patient Instructions (Signed)
Continue to pack the wounds for the next week. At that point they should be very small and can just be washed in the shower or tub and covered with clean gauze.

## 2014-02-19 NOTE — Progress Notes (Signed)
History: Patient returns to the office for followup after a fairly prolonged hospitalization for multiple left breast abscesses. Cultures were all negative although this was a presumed staph or strep. She ended up with 4 separate I&D sites. She was discharged about a week ago. She is just finishing a course of Bactrim. She and her husband states she is feeling steadily better. Less pain and swelling. No fever. He is packing the wounds daily.  Exam: BP 122/80  Pulse 68  Temp(Src) 98.3 F (36.8 C) (Oral)  Resp 16  Ht 5\' 2"  (1.575 m)  Wt 124 lb (56.246 kg)  BMI 22.67 kg/m2 Left breast has 4 separate wounds which were unpacked and all appeared to be clean granulation tissue with no unusual drainage. There is still some moderate induration to the lower half of the breast but much improved from when she was in the hospital and there is no erythema or fluctuants or skin changes.  Assessment and plan: Doing well following I&D of multiple breast abscess. They will continue to pack the wound for about a week and at that point I think the wounds will be very superficial and they can begin washing in the shower and is covering the wounds. Complete her course of antibiotics. He'll call for any worsening and return in 3 weeks.

## 2014-02-25 ENCOUNTER — Ambulatory Visit (INDEPENDENT_AMBULATORY_CARE_PROVIDER_SITE_OTHER): Payer: BC Managed Care – PPO | Admitting: General Surgery

## 2014-02-25 ENCOUNTER — Encounter (INDEPENDENT_AMBULATORY_CARE_PROVIDER_SITE_OTHER): Payer: Self-pay | Admitting: General Surgery

## 2014-02-25 VITALS — BP 122/74 | Temp 98.8°F | Resp 14 | Ht 62.0 in | Wt 122.0 lb

## 2014-02-25 DIAGNOSIS — L0291 Cutaneous abscess, unspecified: Secondary | ICD-10-CM

## 2014-02-25 DIAGNOSIS — L0390 Cellulitis, unspecified: Principal | ICD-10-CM

## 2014-02-25 MED ORDER — DOXYCYCLINE HYCLATE 100 MG PO TABS: 100.0000 mg | ORAL_TABLET | Freq: Two times a day (BID) | ORAL | Status: DC

## 2014-02-25 NOTE — Patient Instructions (Signed)
The four open wounds in your left breast looked good.  We found a new abscess in the left breast, upper outer quadrant which we incised and drained today.  We have called in a 10 day supply of doxycycline antibiotic to your pharmacy. Please start this antibiotic tonight.  Continue to change the wound packing twice a day as you have been  Keep her appointment with Dr. Johna SheriffHoxworth in 2 weeks.

## 2014-02-25 NOTE — Progress Notes (Signed)
Patient ID: Brenda Solomon, female   DOB: 09/05/1981, 33 y.o.   MRN: [id number]  History:  Patient returns to the office for followup after a fairly prolonged hospitalization for multiple left breast abscesses. Cultures were all negative although this was a presumed staph or strep. She ended up with 4 separate I&D sites. Her husband has been packing the 4 abscess cavity wounds twice a day. They came into the urgent office today because of increasing drainage but no fever or pain. She is not on any antibiotics.   Exam:    Left breast has 4 separate wounds which were unpacked and all appeared to be clean granulation tissue with no unusual drainage. There was a new, 2 cm abscess in the left breast upper outer quadrant. Following alcohol prep and 1% Xylocaine with epinephrine local, performed incision and drainage and evacuated a small abscess. Cultures were taken. Although she has a lot of anxiety, she tolerated this well.   Assessment and plan:  Status post I&D of multiple breast abscess. They will continue to pack the wounds Doxycycline 100 mg of by mouth twice a day x10 days. Prescription electronically prescribed to her pharmacy Keep her appointment with Dr. Johna SheriffHoxworth in 2 weeks   Angelia MouldHaywood M. Derrell LollingIngram, M.D., [facility] General and Minimally invasive Surgery Breast and Colorectal Surgery Office:   [phone] Pager:   [phone]

## 2014-02-28 LAB — WOUND CULTURE
GRAM STAIN: NONE SEEN
GRAM STAIN: NONE SEEN
ORGANISM ID, BACTERIA: NO GROWTH

## 2014-03-02 ENCOUNTER — Ambulatory Visit (INDEPENDENT_AMBULATORY_CARE_PROVIDER_SITE_OTHER): Payer: BC Managed Care – PPO | Admitting: General Surgery

## 2014-03-02 ENCOUNTER — Encounter (INDEPENDENT_AMBULATORY_CARE_PROVIDER_SITE_OTHER): Payer: Self-pay | Admitting: General Surgery

## 2014-03-02 VITALS — BP 114/70 | HR 72 | Temp 97.4°F | Ht 62.0 in | Wt 121.0 lb

## 2014-03-02 DIAGNOSIS — N611 Abscess of the breast and nipple: Secondary | ICD-10-CM

## 2014-03-02 DIAGNOSIS — N61 Mastitis without abscess: Secondary | ICD-10-CM

## 2014-03-02 NOTE — Progress Notes (Signed)
Subjective:     Patient ID: Brenda JensenArifa Solomon, female   DOB: 02/19/1981, 33 y.o.   MRN: [id number]  HPI The patient is a 33 year old female who presents today secondary to a recheck of a left breast abscess. The patient was recently seen on March 18 and had checked with a small area that had been I&D. Patient was placed on doxycycline.  Cultures are currently no growth to date.   Patient's had no fevers at home and continue with dressing changes as expected.  Review of Systems  Constitutional: Negative.   HENT: Negative.   Respiratory: Negative.   Cardiovascular: Negative.   Gastrointestinal: Negative.   Neurological: Negative.   All other systems reviewed and are negative.       Objective:   Physical Exam  Constitutional: She is oriented to person, place, and time. She appears well-developed and well-nourished.  HENT:  Head: Normocephalic and atraumatic.  Eyes: Conjunctivae and EOM are normal. Pupils are equal, round, and reactive to light.  Neck: Normal range of motion. Neck supple.  Cardiovascular: Normal rate, regular rhythm and normal heart sounds.   Pulmonary/Chest: Effort normal and breath sounds normal.    A small, 0.25 cm punctate area of erythema no fluctuance, other wound sites are clean dry and intact  Abdominal: Soft. Bowel sounds are normal. She exhibits no distension and no mass. There is no tenderness. There is no rebound and no guarding.  Musculoskeletal: Normal range of motion.  Neurological: She is alert and oriented to person, place, and time.  Skin: Skin is warm and dry.  Psychiatric: She has a normal mood and affect.       Assessment:     33 year old female status post multiple abscesses and drainage procedures. Patient currently  On antibiotics. Her wounds looked not to be infected at this time and had no erythema. The patient is concerned today with a lateral proximally 9:00 area of punctate erythema. The entire area of her breasts is indurated likely  secondary to the scar tissue and healing from the previous incision sites. I cannot locate an area of fluctuance near the area of concern..     Plan:     1 preop have patient continue antibiotics at this time. 2. The patient can continue with warm compresses to the breast. 3. Patient will followup with Dr. Johna SheriffHoxworth for a scheduled.

## 2014-03-05 ENCOUNTER — Ambulatory Visit (INDEPENDENT_AMBULATORY_CARE_PROVIDER_SITE_OTHER): Payer: BC Managed Care – PPO | Admitting: General Surgery

## 2014-03-05 ENCOUNTER — Encounter (INDEPENDENT_AMBULATORY_CARE_PROVIDER_SITE_OTHER): Payer: Self-pay | Admitting: General Surgery

## 2014-03-05 ENCOUNTER — Other Ambulatory Visit (INDEPENDENT_AMBULATORY_CARE_PROVIDER_SITE_OTHER): Payer: Self-pay | Admitting: General Surgery

## 2014-03-05 VITALS — BP 126/70 | HR 75 | Temp 98.7°F | Resp 14 | Wt 122.0 lb

## 2014-03-05 DIAGNOSIS — N61 Mastitis without abscess: Secondary | ICD-10-CM

## 2014-03-05 DIAGNOSIS — L0291 Cutaneous abscess, unspecified: Secondary | ICD-10-CM

## 2014-03-05 DIAGNOSIS — N611 Abscess of the breast and nipple: Secondary | ICD-10-CM

## 2014-03-05 DIAGNOSIS — L0390 Cellulitis, unspecified: Secondary | ICD-10-CM

## 2014-03-05 MED ORDER — DOXYCYCLINE HYCLATE 100 MG PO TABS: 100.0000 mg | ORAL_TABLET | Freq: Two times a day (BID) | ORAL | Status: DC

## 2014-03-05 NOTE — Patient Instructions (Signed)
No longer need to pack wound Warm compresses Finish antibiotics Follow up with dr Johna Sheriffhoxworth

## 2014-03-07 NOTE — Progress Notes (Signed)
Subjective:     Patient ID: Brenda Solomon, female   DOB: 11/18/1981, 33 y.o.   MRN: [id number]  HPI [name] comes in because of concerns of a new left breast abscess. The patient has a complicated left breast abscess history. She has had multiple left breast abscess. She required 2 different debridements in the operating room in late February. She is currently still packing for open wounds. She was just seen in urgent office 2 days ago by Dr. Derrell Lollingamirez because of concerns in the left breast at 9:00 position. She states the area is tender, swollen and raised. She denies any fevers or chills. She is still taking antibiotics.  Review of Systems     Objective:   Physical Exam  Vitals reviewed. Constitutional: She is oriented to person, place, and time. She appears well-developed and well-nourished. No distress.  HENT:  Head: Normocephalic and atraumatic.  Right Ear: External ear normal.  Pulmonary/Chest:    Left breast demonstrates 4 small open wounds that are not too deep. In the lateral aspect of the left breast at around the 3:00 position about 5 cm from the nipple there is an area of tenderness, mild redness of the skin as well as what feels like some fluctuance.   Neurological: She is alert and oriented to person, place, and time.  Skin: Skin is warm and dry. She is not diaphoretic.  Psychiatric: She has a normal mood and affect. Her behavior is normal. Judgment and thought content normal.   BP 126/70  Pulse 75  Temp(Src) 98.7 F (37.1 C) (Oral)  Resp 14  Wt 122 lb (55.339 kg)     Assessment:     Chronic left breast abscesses     Plan:     There does appear to be an area of bogginess and mild cellulitis at the 3:00 position in the left breast. I recommended aspirating the area. After obtaining verbal consent, the area was prepped with ChloraPrep. 3 cc of 1% Xylocaine with epinephrine was infiltrated over the area. Then taking a 20 cc syringe with a 16-gauge needle  I aspirated the area and only got about 10 cc of dark blood. Because there is no frank purulence I did not incise the area. A Band-Aid was placed over the area.  With respect to the four other wounds in her left breast they do not appear deep enough to require packing anymore. I just encouraged her to place a dry gauze over the breast and to wear her surgicalbra.  We will send the aspirated fluid off for culture. I renewed her antibiotic for doxycycline.  She has several questions regarding the biopsy that she had done interoperatively. We discussed her culture results and biopsy results today. She was provided copies of both. Her biopsy was negative for malignancy. Her cultures were negative to date.  [name]  Brenda Solomon M. Andrey CampanileWilson, MD, FACS General, Bariatric, & Minimally Invasive Surgery [facility] [place]

## 2014-03-09 LAB — WOUND CULTURE
GRAM STAIN: NONE SEEN
Gram Stain: NONE SEEN
Gram Stain: NONE SEEN
Organism ID, Bacteria: NO GROWTH

## 2014-03-12 ENCOUNTER — Telehealth (INDEPENDENT_AMBULATORY_CARE_PROVIDER_SITE_OTHER): Payer: Self-pay

## 2014-03-12 ENCOUNTER — Ambulatory Visit (INDEPENDENT_AMBULATORY_CARE_PROVIDER_SITE_OTHER): Payer: BC Managed Care – PPO | Admitting: General Surgery

## 2014-03-12 ENCOUNTER — Encounter (INDEPENDENT_AMBULATORY_CARE_PROVIDER_SITE_OTHER): Payer: Self-pay | Admitting: General Surgery

## 2014-03-12 ENCOUNTER — Other Ambulatory Visit (INDEPENDENT_AMBULATORY_CARE_PROVIDER_SITE_OTHER): Payer: Self-pay | Admitting: General Surgery

## 2014-03-12 ENCOUNTER — Encounter (INDEPENDENT_AMBULATORY_CARE_PROVIDER_SITE_OTHER): Payer: BC Managed Care – PPO | Admitting: General Surgery

## 2014-03-12 VITALS — BP 122/80 | HR 74 | Temp 97.2°F | Wt 122.0 lb

## 2014-03-12 DIAGNOSIS — N611 Abscess of the breast and nipple: Secondary | ICD-10-CM

## 2014-03-12 DIAGNOSIS — N61 Mastitis without abscess: Secondary | ICD-10-CM

## 2014-03-12 MED ORDER — SULFAMETHOXAZOLE-TMP DS 800-160 MG PO TABS: 1.0000 | ORAL_TABLET | Freq: Two times a day (BID) | ORAL | Status: DC

## 2014-03-12 MED ORDER — OXYCODONE HCL 20 MG PO TABS: 10.0000 mg | ORAL_TABLET | ORAL | Status: DC | PRN

## 2014-03-12 NOTE — Progress Notes (Signed)
Chief complaint: Recurrent breast abscess  History: Patient returns to the office with a history of multiple and recurrent left breast abscesses over about the last month, initially drained in the office and then subsequently admitted to the hospital with 2 drainage procedures under general anesthesia and prolonged wound care and IV vancomycin in the hospital. She has been home for a couple of weeks and initially doing very well but has developed 2 separate areas of redness and swelling in the lateral and inferior left breast initially seen here in the office over the past week without definite abscess seen but she feels the areas are getting a little worse  Exam: BP 122/80  Pulse 74  Temp(Src) 97.2 F (36.2 C) (Oral)  Wt 122 lb (55.339 kg) At the 3:00 and 6:00 position of the left breast are 2 approximately 2 cm areas of superficial fluctuance and erythema. Underlying deep breast tissue is not indurated or firm as she had previously with her multiple deep abscesses. Other previous I&D sites are closing with granulation  Assessment and plan: Recurrent breast abscesses. These appear quite superficial compared to her previous abscesses. I recommended I&D under local anesthesia in the office and she was agreeable. Each site was sterilely prepped and infiltrated with local anesthesia. At the 9:00 position I obtained a moderate amount of thick pus, probably 7 or 8 cc. Laterally there was also purulence but a smaller amount. Each wound was packed with iodoform gauze which he will remove tomorrow and continue dressing changes. We will continue her vancomycin for now. Repeat cultures were sent for routine and acid-fast bacilli. Return in one week or sooner as needed.

## 2014-03-12 NOTE — Telephone Encounter (Signed)
Called and spoke to Metoliusiffany in [employer] [facility] regarding breast culture test that need's to be ordered along with Routine Culture Protocol.  Dr. Johna SheriffHoxworth would also like to have an Acid-Fast Bacilli Test on breast Culture.  Microbiology advised me to make note in Comments on the order requisition for Acid Fast Bacilli test.

## 2014-03-15 LAB — WOUND CULTURE
GRAM STAIN: NONE SEEN
Gram Stain: NONE SEEN
Organism ID, Bacteria: NO GROWTH

## 2014-03-19 ENCOUNTER — Ambulatory Visit (INDEPENDENT_AMBULATORY_CARE_PROVIDER_SITE_OTHER): Payer: BC Managed Care – PPO | Admitting: General Surgery

## 2014-03-19 ENCOUNTER — Encounter (INDEPENDENT_AMBULATORY_CARE_PROVIDER_SITE_OTHER): Payer: Self-pay | Admitting: General Surgery

## 2014-03-19 VITALS — BP 82/60 | HR 68 | Temp 97.0°F | Resp 14 | Wt 121.0 lb

## 2014-03-19 DIAGNOSIS — N611 Abscess of the breast and nipple: Secondary | ICD-10-CM

## 2014-03-19 DIAGNOSIS — N61 Mastitis without abscess: Secondary | ICD-10-CM

## 2014-03-19 NOTE — Progress Notes (Signed)
Chief complaint: Followup multiple left breast abscess  History: The patient returns for followup for her recurrent and multiple left breast abscesses. We drained 2 small superficial abscesses at her last visit. These are better but there is one spot laterally in the left breast that is bothering her.   Exam: BP 82/60  Pulse 68  Temp(Src) 97 F (36.1 C) (Temporal)  Resp 14  Wt 121 lb (54.885 kg) There are multiple healing I&D sites with clean granulation tissue and some induration but no erythema or fluctuance or purulence. However laterally at the 3:00 position is a 2 cm area of superficial fluctuance and erythema.  After discussing the procedure and local anesthesia I did a small I&D and drained several cc of thick purulent fluid. All the wounds were repacked with iodoform. She will continue current antibiotics. Return in one week for a recheck.

## 2014-03-19 NOTE — Patient Instructions (Signed)
Continue current wound care and antibiotics

## 2014-03-25 ENCOUNTER — Encounter (INDEPENDENT_AMBULATORY_CARE_PROVIDER_SITE_OTHER): Payer: Self-pay | Admitting: General Surgery

## 2014-03-25 ENCOUNTER — Ambulatory Visit (INDEPENDENT_AMBULATORY_CARE_PROVIDER_SITE_OTHER): Payer: BC Managed Care – PPO | Admitting: General Surgery

## 2014-03-25 VITALS — BP 122/80 | HR 72 | Temp 97.0°F | Resp 14 | Wt 120.0 lb

## 2014-03-25 DIAGNOSIS — N611 Abscess of the breast and nipple: Secondary | ICD-10-CM

## 2014-03-25 DIAGNOSIS — N61 Mastitis without abscess: Secondary | ICD-10-CM

## 2014-03-25 MED ORDER — SULFAMETHOXAZOLE-TMP DS 800-160 MG PO TABS: 1.0000 | ORAL_TABLET | Freq: Two times a day (BID) | ORAL | Status: DC

## 2014-03-25 NOTE — Patient Instructions (Signed)
Shower twice a day and then do dressing change.

## 2014-03-25 NOTE — Progress Notes (Addendum)
She is here for followup visit. She has had multiple abscesses drained in her left breast. Dr. Johna SheriffHoxworth saw her last week and did a drainage of one in the 3:00 position. She still has some swelling around this area. Her husband is doing her dressing changes. She is completing her course of antibiotics now.  On exam, the left breast demonstrates multiple open wounds with some serous drainage. Packing or in some of them. She has an area of induration just lateral to the 3:00 wound. No fluctuance.  Assessment: Multiple left breast abscesses status post incision and drainage. Still has some areas of induration slice feel she still needs to be on antibiotics.  She also has long standing anemia.  Plan: Refill Bactrim prescription. Infectious disease consult. Return to see Dr. Johna SheriffHoxworth next week.  I told them her anemia should be evaluated by a PCP.

## 2014-03-26 ENCOUNTER — Other Ambulatory Visit (INDEPENDENT_AMBULATORY_CARE_PROVIDER_SITE_OTHER): Payer: Self-pay | Admitting: General Surgery

## 2014-03-26 DIAGNOSIS — N611 Abscess of the breast and nipple: Secondary | ICD-10-CM

## 2014-03-30 ENCOUNTER — Ambulatory Visit (INDEPENDENT_AMBULATORY_CARE_PROVIDER_SITE_OTHER): Payer: BC Managed Care – PPO | Admitting: Infectious Diseases

## 2014-03-30 ENCOUNTER — Encounter: Payer: Self-pay | Admitting: Infectious Diseases

## 2014-03-30 ENCOUNTER — Other Ambulatory Visit: Payer: Self-pay | Admitting: Infectious Diseases

## 2014-03-30 VITALS — BP 109/76 | HR 103 | Temp 99.0°F | Wt 122.0 lb

## 2014-03-30 DIAGNOSIS — N611 Abscess of the breast and nipple: Secondary | ICD-10-CM

## 2014-03-30 DIAGNOSIS — N61 Mastitis without abscess: Secondary | ICD-10-CM

## 2014-03-30 NOTE — Assessment & Plan Note (Addendum)
I would consider the etiology of this to be an atypical bacteria (such as actinomyces, nocardia, propionibacterium), hidradenitis supurativa or TB (or mycobacteria that is not TB). I have swabbed a wound and am sending it for AFB, routine and anaerobic cx. She has another lesions which she is concerned will burst soon. I have asked her (and her husband) that this also gets sent for AFB Cx as well as tissue sent for AFB pathology/Cx.  Cancer is certainly a possibility but seems less likely given her previous negative pathology.  Will see her back in 2-3 weeks.

## 2014-03-30 NOTE — Progress Notes (Signed)
   Subjective:    Patient ID: Brenda JensenArifa Solomon, female    DOB: 06/25/1981, 33 y.o.   MRN: [id number]  HPI 33 yo [place] (in [place] ~ [place] years) F with hx of L breast abscesses these started in late January 2015. [facility], given anbx which did not help. She was seen at breast center, had mammogram (findings are suspicious for either a severe inflammatory process or inflammatory carcinoma of the left breast). She has been seen in surgery clinic. She has had drainage done on 2/2, 2/23, 3/18, 3/26 by surgery. Each of these has had a negative Cx. She had pathology sent on 2-24 which did not demonstrate malignancy.  No further fever or chills. Continues to have cloudy bloody d/c.  For 2.5 months she has been on doxy, clinda or bactrim.   Had PPD (-) when she came to [place].   Review of Systems  Constitutional: Negative for fever and chills.  Respiratory: Negative for cough and shortness of breath.   Gastrointestinal: Negative for diarrhea and constipation.  Genitourinary: Negative for difficulty urinating.  Hematological: Negative for adenopathy.       Objective:   Physical Exam  Constitutional: She appears well-developed and well-nourished.  HENT:  Mouth/Throat: No oropharyngeal exudate.  Eyes: EOM are normal. Pupils are equal, round, and reactive to light.  Neck: Neck supple.  Cardiovascular: Normal rate, regular rhythm and normal heart sounds.   Pulmonary/Chest: Effort normal and breath sounds normal.    Abdominal: Soft. Bowel sounds are normal. She exhibits no distension. There is no tenderness.  Musculoskeletal: She exhibits no edema.  Lymphadenopathy:    She has no cervical adenopathy.          Assessment & Plan:

## 2014-04-02 ENCOUNTER — Encounter (INDEPENDENT_AMBULATORY_CARE_PROVIDER_SITE_OTHER): Payer: BC Managed Care – PPO | Admitting: General Surgery

## 2014-04-03 ENCOUNTER — Other Ambulatory Visit (INDEPENDENT_AMBULATORY_CARE_PROVIDER_SITE_OTHER): Payer: Self-pay | Admitting: General Surgery

## 2014-04-03 ENCOUNTER — Ambulatory Visit (INDEPENDENT_AMBULATORY_CARE_PROVIDER_SITE_OTHER): Payer: BC Managed Care – PPO | Admitting: General Surgery

## 2014-04-03 ENCOUNTER — Encounter (INDEPENDENT_AMBULATORY_CARE_PROVIDER_SITE_OTHER): Payer: Self-pay | Admitting: General Surgery

## 2014-04-03 VITALS — BP 124/70 | HR 68 | Temp 97.0°F | Resp 14 | Wt 120.6 lb

## 2014-04-03 DIAGNOSIS — N61 Mastitis without abscess: Secondary | ICD-10-CM

## 2014-04-03 DIAGNOSIS — N611 Abscess of the breast and nipple: Secondary | ICD-10-CM

## 2014-04-03 LAB — BODY FLUID CULTURE
GRAM STAIN: NONE SEEN
GRAM STAIN: NONE SEEN
Organism ID, Bacteria: NO GROWTH

## 2014-04-03 MED ORDER — DOXYCYCLINE HYCLATE 100 MG PO TABS: 100.0000 mg | ORAL_TABLET | Freq: Two times a day (BID) | ORAL | Status: DC

## 2014-04-03 NOTE — Addendum Note (Signed)
Addended by: Maryan PulsMOORE, Kadyn Chovan on: 04/03/2014 04:09 PM   Modules accepted: Orders

## 2014-04-03 NOTE — Progress Notes (Signed)
Chief complaint: Followup of left breast abscess  History: Patient returns for further followup for her multiple and recurrent left breast abscesses. Over the last week she has developed 3 new areas of pain and swelling. She saw infectious disease who recommended a tissue culture for AFB. Currently her last set of cultures for AFB and standard cultures are negative. Overall she feels that her breast is generally less hard and tender.  Exam: BP 124/70  Pulse 68  Temp(Src) 97 F (36.1 C)  Resp 14  Wt 120 lb 9.6 oz (54.704 kg) Gen.: No acute distress Breasts: There are multiple I&D sites in the left breast with clean granulation tissue and overall there is less firmness and swelling to the breast without any generalized erythema. There are 3 superficial discrete abscesses, 2 just beneath the areola and one laterally in the breast. After explaining the procedure as previously under local anesthesia I incised and drained all 3 of these areas obtaining purulent material. Each was packed open with 4 gauze. We did obtain a small amount of tissue from one of the areas for tissue culture for AFB.  Assessment and plan: Multiple recurrent breast abscesses. Infectious disease following. Concern for unusual organism due to persistence. Cultures pending. She is finishing her Bactrim going to switch her back to doxycycline for 2 weeks. Husband understands wound care and is doing a good job. She will return in 2 weeks or sooner if needed.

## 2014-04-07 LAB — PATHOLOGY

## 2014-04-15 ENCOUNTER — Ambulatory Visit (INDEPENDENT_AMBULATORY_CARE_PROVIDER_SITE_OTHER): Payer: BC Managed Care – PPO | Admitting: Infectious Diseases

## 2014-04-15 ENCOUNTER — Encounter: Payer: Self-pay | Admitting: Infectious Diseases

## 2014-04-15 VITALS — BP 114/79 | HR 96 | Temp 98.1°F | Ht 62.0 in | Wt 120.0 lb

## 2014-04-15 DIAGNOSIS — N611 Abscess of the breast and nipple: Secondary | ICD-10-CM

## 2014-04-15 DIAGNOSIS — N61 Mastitis without abscess: Secondary | ICD-10-CM

## 2014-04-15 NOTE — Addendum Note (Signed)
Addended by: Dashawna Delbridge C on: 04/15/2014 09:41 AM   Modules accepted: Orders

## 2014-04-15 NOTE — Assessment & Plan Note (Addendum)
I have submitted her case to the emerging infections network (run via [employer]). The suggested etiologies for her recurrent lesions are: granulomatis mastitis, hidradenitis, TB, atypical mycobacteria, or pyoderma gangrenosum.  Further confusing part of this case is the lack of granuloma on her BX that would suggest TB or granulomatis mastitis. I [facility] this case with [facility] who felt that this was a routine bacterial infection. I doubt this is the case as she has had multiple negative Cx and been on multiple courses of anbx without improvement.   I attempted [facility] without getting a call back.  I would consider giving her a trial of steroids (for granulomatis mastitis), she wishes to defer til after she returns from visiting her home country.  I will have her seen by derm locally. She wishes to defer til after she returns from visiting he home country.  I offered to have a quantiferon gold blood test done on her (as well as HIV Ab) but she left prior to having these done.  Will see her back when she returns (4-6 weeks).

## 2014-04-15 NOTE — Progress Notes (Signed)
   Subjective:    Patient ID: Brenda JensenArifa Durell, female    DOB: 12/03/1981, 33 y.o.   MRN: [id number]  HPI 33 yo [place] (in [place] ~ 5 years) F with hx of L breast abscesses these started in late January 2015. [facility], given anbx which did not help. She was seen at breast center, had mammogram (findings are suspicious for either a severe inflammatory process or inflammatory carcinoma of the left breast).  She has been seen in surgery clinic. She has had drainage done on 2/2, 2/23, 3/18, 3/26 by surgery. Each of these has had a negative Cx. She had pathology sent on 2-24 which did not demonstrate malignancy. She had PAS and AFB staining added to this which did not demonstrate any organisms.  She has been on bactrim since her last visit and has still developed 2 more lesions. Continues to have cloudy drainage, bloody.  She has fungal and AFB Cx which have been no growth to date, stains negative.  occas feels chills.    Review of Systems  Constitutional: Positive for chills. Negative for fever.  Respiratory: Negative for cough and shortness of breath.        Objective:   Physical Exam        Assessment & Plan:

## 2014-04-16 ENCOUNTER — Telehealth: Payer: Self-pay | Admitting: *Deleted

## 2014-04-16 NOTE — Telephone Encounter (Signed)
Spoke with patient, asked her to come in 5/8 for labwork that Dr. Ninetta LightsHatcher meant to collect after her office visit 04/15/14.  Pt will come tomorrow at 9:00. Andree CossMichelle M Howell, RN

## 2014-04-17 ENCOUNTER — Ambulatory Visit (INDEPENDENT_AMBULATORY_CARE_PROVIDER_SITE_OTHER): Payer: BC Managed Care – PPO | Admitting: General Surgery

## 2014-04-17 ENCOUNTER — Other Ambulatory Visit (INDEPENDENT_AMBULATORY_CARE_PROVIDER_SITE_OTHER): Payer: Self-pay | Admitting: General Surgery

## 2014-04-17 ENCOUNTER — Encounter (INDEPENDENT_AMBULATORY_CARE_PROVIDER_SITE_OTHER): Payer: Self-pay | Admitting: General Surgery

## 2014-04-17 ENCOUNTER — Other Ambulatory Visit: Payer: BC Managed Care – PPO

## 2014-04-17 VITALS — BP 100/68 | HR 71 | Temp 98.6°F | Resp 16 | Wt 119.8 lb

## 2014-04-17 DIAGNOSIS — N61 Mastitis without abscess: Secondary | ICD-10-CM

## 2014-04-17 DIAGNOSIS — N611 Abscess of the breast and nipple: Secondary | ICD-10-CM

## 2014-04-17 MED ORDER — OXYCODONE HCL 20 MG PO TABS: 10.0000 mg | ORAL_TABLET | ORAL | Status: DC | PRN

## 2014-04-17 MED ORDER — DOXYCYCLINE HYCLATE 100 MG PO TABS: 100.0000 mg | ORAL_TABLET | Freq: Two times a day (BID) | ORAL | Status: DC

## 2014-04-17 NOTE — Addendum Note (Signed)
Addended by: Maryan PulsMOORE, Marygrace Sandoval on: 04/17/2014 03:58 PM   Modules accepted: Orders

## 2014-04-17 NOTE — Progress Notes (Signed)
Chief complaint: Followup of left breast abscess  History: Patient returns for further followup for her multiple and recurrent left breast abscesses. Over the last 2 weeks she has developed 2 new areas of pain and swelling. She saw infectious disease again. All AFB studies were negative. At this point infectious disease has no further recommendations. She remains on doxycycline for now.  Exam: BP 100/68  Pulse 71  Temp(Src) 98.6 F (37 C) (Temporal)  Resp 16  Wt 119 lb 12.8 oz (54.341 kg)  LMP 03/17/2014 Gen.: No acute distress Breasts: There are multiple I&D sites in the left breast with clean granulation tissue and overall there is less firmness and swelling to the breast without any generalized erythema. There are 2 New superficial discrete abscesses,one medial and one lateral.. After explaining the procedure as previously under local anesthesia I incised and drained bothof these areas obtaining purulent material. Each was packed open with nu gauze.   Assessment and plan: Multiple recurrent breast abscesses. Infectious disease has no new suggestions.. Cultures and AFB studies have been unrevealing. continue doxycycline for 2 weeks. Husband understands wound care and is doing a good job. Am going to repeat an ultrasound of the breast to make sure there is no deeper underlying abscess not apparent on exam. She will return in 2 weeks or sooner if needed.

## 2014-04-17 NOTE — Addendum Note (Signed)
Addended by: Maryan PulsMOORE, Oletha Tolson on: 04/17/2014 03:54 PM   Modules accepted: Orders

## 2014-04-18 LAB — HIV ANTIBODY (ROUTINE TESTING W REFLEX): HIV: NONREACTIVE

## 2014-04-20 ENCOUNTER — Ambulatory Visit
Admission: RE | Admit: 2014-04-20 | Discharge: 2014-04-20 | Disposition: A | Discharge status: Home / Self Care | Payer: BC Managed Care – PPO | Source: Ambulatory Visit | Attending: General Surgery | Admitting: General Surgery

## 2014-04-20 ENCOUNTER — Other Ambulatory Visit (INDEPENDENT_AMBULATORY_CARE_PROVIDER_SITE_OTHER): Payer: Self-pay | Admitting: General Surgery

## 2014-04-20 DIAGNOSIS — N611 Abscess of the breast and nipple: Secondary | ICD-10-CM

## 2014-04-20 DIAGNOSIS — N61 Mastitis without abscess: Secondary | ICD-10-CM

## 2014-04-20 LAB — QUANTIFERON TB GOLD ASSAY (BLOOD)
Mitogen value: 0.26 IU/mL
QUANTIFERON TB AG MINUS NIL: 0.01 [IU]/mL
Quantiferon Nil Value: 0.06 IU/mL
TB Ag value: 0.07 IU/mL

## 2014-04-22 ENCOUNTER — Telehealth: Payer: Self-pay | Admitting: *Deleted

## 2014-04-22 NOTE — Telephone Encounter (Signed)
Patient called for her lab results; please advise. Falicity Sheets  

## 2014-04-23 ENCOUNTER — Telehealth (INDEPENDENT_AMBULATORY_CARE_PROVIDER_SITE_OTHER): Payer: Self-pay

## 2014-04-23 LAB — CULTURE, ROUTINE-ABSCESS
Culture: NO GROWTH
Special Requests: 12

## 2014-04-23 NOTE — Telephone Encounter (Signed)
Patient stopped by our office today and asked me to get a message to Dr. Johna SheriffHoxworth.   Patient would like for Dr. Johna SheriffHoxworth to know that she's leaving the country Monday (04/27/14) for a month. Dr. Johna SheriffHoxworth paged.  Patient advised that Dr. Johna SheriffHoxworth felt she should not travel to another country at this time due to her current chronic breast infections.  Patient reports that she's doing okay at this time.  Patient states that she will not change her travel plans and felt that she would be okay for travel.  Patient was inquiring about her recent [place] results and what the next plan of care would be for her.  I briefly discussed with patient that she may be a candidate for NL Lumpectomy if she has multiple localized areas but, this cannot be determined at this time.  Patient advised she will need to discuss her Plan of Care with Dr. Johna SheriffHoxworth.  Patient again encouraged not to go out of the country at this time due to her current medical condition of multiple inflammatory breast abscess/infections.  Again, patient wishes to continue with her travel as planned on Monday 04/27/14 and will return in one month.

## 2014-04-23 NOTE — Telephone Encounter (Signed)
Patient notified and she can not come in for appointment as she is traveling at this time. Brenda Solomon

## 2014-04-23 NOTE — Telephone Encounter (Signed)
HIV negative TB test indeterminate.  If she has questions, she needs appt, f/u with derm appt as well.

## 2014-04-24 ENCOUNTER — Telehealth (INDEPENDENT_AMBULATORY_CARE_PROVIDER_SITE_OTHER): Payer: Self-pay

## 2014-04-24 NOTE — Telephone Encounter (Signed)
Incoming call from patient:  Patient calling into office this afternoon asking if Dr. Jamse MeadHoxworth's aware that she's leaving Monday 04/24/14 to go out of the country. I advised patient that I did page Dr. Johna SheriffHoxworth yesterday and he's aware.  I reminded patient that with her multiple recurrent breast infections that per Dr. Johna SheriffHoxworth traveling out of the country is against medical advice and she should wait until her medical condition is more stable.  Patient voiced understanding.  Patient states she's not changing her mind and will continue as planned on 04/27/14.  Again, I stressed to the patient that traveling to another country with her current medical condition is not recommended. Patient states she's understands but, has no plans on changing her mind and will continue as planned.   Patient will call our office to schedule a follow up when she returns.  Patient states "thank you both for all your help.  I just had a baby and I am just tired of this disease that I have". I need a break and I wish to continue with my travel"  Patient was asked if there's anything I can do for her or if she need's anything, she replied "no".

## 2014-04-28 LAB — FUNGUS CULTURE W SMEAR: SMEAR RESULT: NONE SEEN

## 2014-05-05 ENCOUNTER — Encounter (INDEPENDENT_AMBULATORY_CARE_PROVIDER_SITE_OTHER): Payer: BC Managed Care – PPO | Admitting: General Surgery

## 2014-05-12 LAB — AFB CULTURE WITH SMEAR (NOT AT ARMC): ACID FAST SMEAR: NONE SEEN

## 2014-07-28 ENCOUNTER — Telehealth: Payer: Self-pay | Admitting: *Deleted

## 2014-07-28 NOTE — Telephone Encounter (Signed)
Called patient per Dr. Ninetta LightsHatcher to see if she has returned from her international travel and to see if she is still in need of our services or a referral to dermatology.  Asked her to call [facility] at her convenience. Andree CossHowell, Michelle M, RN

## 2014-08-06 ENCOUNTER — Telehealth: Payer: Self-pay | Admitting: *Deleted

## 2014-08-06 NOTE — Telephone Encounter (Signed)
Pt has returned from [place], much improved.  She stated that she was seen while in her country for the breast problem.  Pt wondering if she does need to return to see Dr. Ninetta Lights since she is better.  MD please advise.

## 2014-08-10 NOTE — Telephone Encounter (Signed)
She does not need to return if this is resolved.

## 2014-10-12 ENCOUNTER — Encounter (INDEPENDENT_AMBULATORY_CARE_PROVIDER_SITE_OTHER): Payer: Self-pay | Admitting: General Surgery

## 2014-12-14 ENCOUNTER — Encounter: Payer: Self-pay | Admitting: *Deleted

## 2014-12-14 DIAGNOSIS — Z3201 Encounter for pregnancy test, result positive: Secondary | ICD-10-CM

## 2014-12-14 LAB — PREGNANCY, URINE: Preg Test, Ur: POSITIVE

## 2014-12-14 NOTE — Progress Notes (Signed)
Received new ob referral from [facility]. Patient had positive pregnancy test on 12/14/13. Pts reported lmp was on 11/05/14. Ultrasound for viability and dating ordered.

## 2014-12-15 ENCOUNTER — Telehealth: Payer: Self-pay | Admitting: *Deleted

## 2014-12-15 DIAGNOSIS — O3680X1 Pregnancy with inconclusive fetal viability, fetus 1: Secondary | ICD-10-CM

## 2014-12-15 NOTE — Telephone Encounter (Signed)
Entered in error/cah

## 2014-12-18 ENCOUNTER — Encounter: Payer: Self-pay | Admitting: *Deleted

## 2015-01-05 ENCOUNTER — Ambulatory Visit (HOSPITAL_COMMUNITY)
Admission: RE | Admit: 2015-01-05 | Discharge: 2015-01-05 | Disposition: A | Discharge status: Home / Self Care | Payer: BLUE CROSS/BLUE SHIELD | Source: Ambulatory Visit | Attending: Obstetrics & Gynecology | Admitting: Obstetrics & Gynecology

## 2015-01-05 ENCOUNTER — Other Ambulatory Visit: Payer: Self-pay | Admitting: Obstetrics & Gynecology

## 2015-01-05 DIAGNOSIS — Z3201 Encounter for pregnancy test, result positive: Secondary | ICD-10-CM

## 2015-01-05 DIAGNOSIS — Z3A08 8 weeks gestation of pregnancy: Secondary | ICD-10-CM | POA: Diagnosis not present

## 2015-01-05 DIAGNOSIS — Z36 Encounter for antenatal screening of mother: Secondary | ICD-10-CM | POA: Diagnosis present

## 2015-01-14 ENCOUNTER — Other Ambulatory Visit: Payer: Self-pay | Admitting: Obstetrics & Gynecology

## 2015-01-14 ENCOUNTER — Encounter: Payer: Self-pay | Admitting: Obstetrics & Gynecology

## 2015-01-14 ENCOUNTER — Ambulatory Visit (INDEPENDENT_AMBULATORY_CARE_PROVIDER_SITE_OTHER): Payer: BLUE CROSS/BLUE SHIELD | Admitting: Obstetrics & Gynecology

## 2015-01-14 VITALS — BP 99/57 | HR 88 | Temp 97.6°F | Wt 130.6 lb

## 2015-01-14 DIAGNOSIS — Z23 Encounter for immunization: Secondary | ICD-10-CM | POA: Diagnosis not present

## 2015-01-14 DIAGNOSIS — Z113 Encounter for screening for infections with a predominantly sexual mode of transmission: Secondary | ICD-10-CM | POA: Diagnosis not present

## 2015-01-14 DIAGNOSIS — O3421 Maternal care for scar from previous cesarean delivery: Secondary | ICD-10-CM

## 2015-01-14 DIAGNOSIS — Z118 Encounter for screening for other infectious and parasitic diseases: Secondary | ICD-10-CM | POA: Diagnosis not present

## 2015-01-14 DIAGNOSIS — Z1151 Encounter for screening for human papillomavirus (HPV): Secondary | ICD-10-CM | POA: Diagnosis not present

## 2015-01-14 DIAGNOSIS — O2341 Unspecified infection of urinary tract in pregnancy, first trimester: Secondary | ICD-10-CM

## 2015-01-14 DIAGNOSIS — Z124 Encounter for screening for malignant neoplasm of cervix: Secondary | ICD-10-CM | POA: Diagnosis not present

## 2015-01-14 DIAGNOSIS — B951 Streptococcus, group B, as the cause of diseases classified elsewhere: Secondary | ICD-10-CM

## 2015-01-14 DIAGNOSIS — O34219 Maternal care for unspecified type scar from previous cesarean delivery: Secondary | ICD-10-CM

## 2015-01-14 DIAGNOSIS — S3769XS Other injury of uterus, sequela: Secondary | ICD-10-CM

## 2015-01-14 DIAGNOSIS — S3769XA Other injury of uterus, initial encounter: Secondary | ICD-10-CM | POA: Insufficient documentation

## 2015-01-14 DIAGNOSIS — Z3682 Encounter for antenatal screening for nuchal translucency: Secondary | ICD-10-CM

## 2015-01-14 LAB — POCT URINALYSIS DIP (DEVICE)
Bilirubin Urine: NEGATIVE
GLUCOSE, UA: 250 mg/dL — AB
Hgb urine dipstick: NEGATIVE
Ketones, ur: NEGATIVE mg/dL
Leukocytes, UA: NEGATIVE
Nitrite: NEGATIVE
Protein, ur: NEGATIVE mg/dL
Specific Gravity, Urine: 1.015 (ref 1.005–1.030)
Urobilinogen, UA: 0.2 mg/dL (ref 0.0–1.0)
pH: 7 (ref 5.0–8.0)

## 2015-01-14 NOTE — Progress Notes (Signed)
Subjective:    Brenda Solomon is a 34 y.o. G3P2001 at [redacted]w[redacted]d by LMP c/w 1st trimester scan being seen today for her first obstetrical visit.  Her obstetrical history is significant for uterine rupture at 37 weeks resulting in IUFD in previous pregnancy; had cesarean section x 1 prior the uterine rupture.  Needed cesarean delivery for the IUFD; now has two previous cesarean sections.  Patient does intend to breast feed. Pregnancy history fully reviewed.  Patient reports no complaints.  Filed Vitals:   01/14/15 1025  BP: 99/57  Pulse: 88  Temp: 97.6 F (36.4 C)  Weight: 130 lb 9.6 oz (59.24 kg)    HISTORY: OB History  Gravida Para Term Preterm AB SAB TAB Ectopic Multiple Living  # Outcome Date GA Lbr Len/2nd Weight Sex Delivery Anes PTL Lv  3 Current           2 Term 02/16/12 [redacted]w[redacted]d  5 lb 10.8 oz (2.575 kg) F CS-LTranv EPI  FD     Comments: maternal uterine rupture, IUFD  1 Term 12/18/10 [redacted]w[redacted]d  5 lb 2 oz (2.325 kg) F CS-LTranv        Comments: C-section due to being breech    Obstetric Comments  Gestational diabetes during first pregnancy, diet-controlled   Past Medical History  Diagnosis Date  . Gestational diabetes 2013  . Abscess of breast 01/12/2014    Left breast. I&D 01/12/2014    Past Surgical History  Procedure Laterality Date  . Cesarean section  2012  . Cesarean section  02/16/2012    Procedure: CESAREAN SECTION;  Surgeon: Tereso Newcomer, MD;  Location: [facility];  Service: Gynecology;  Laterality: N/A;  Repair of Ruptured Uterus  . Breast surgery    . Irrigation and debridement abscess Left 02/03/2014    Procedure: IRRIGATION AND DEBRIDEMENT ABSCESS;  Surgeon: Romie Levee, MD;  Location: [facility];  Service: General;  Laterality: Left;  . Incision and drainage abscess Left 02/07/2014    Procedure: INCISION AND DRAINAGE ABSCESS LEFT BREAST;  Surgeon: Mariella Saa, MD;  Location: [facility];  Service: General;  Laterality: Left;   Family History    Problem Relation Age of Onset  . Diabetes type II Mother   . Diabetes Mother   . Diabetes type II Father   . Diabetes Father      Exam    Uterus:     Pelvic Exam:    Perineum: No Hemorrhoids, Normal Perineum   Vulva: normal   Vagina:  normal mucosa, normal discharge   Cervix: no bleeding following Pap, no cervical motion tenderness and no lesions   Adnexa: normal adnexa and no mass, fullness, tenderness   Bony Pelvis: average  System: Breast:  normal appearance, no masses or tenderness, Inspection negative   Skin: normal coloration and turgor, no rashes    Neurologic: oriented, normal   Extremities: normal strength, tone, and muscle mass   HEENT PERRLA and extra ocular movement intact   Mouth/Teeth mucous membranes moist, pharynx normal without lesions and dental hygiene good   Neck supple and no masses   Cardiovascular: regular rate and rhythm   Respiratory:  appears well, vitals normal, no respiratory distress, acyanotic, normal RR, chest clear, no wheezing, crepitations, rhonchi, normal symmetric air entry   Abdomen: soft, non-tender; bowel sounds normal; no masses,  no organomegaly   Urinary: urethral meatus normal      Assessment:  Pregnancy: G3P2001 Patient Active Problem List   Diagnosis Date Noted  . Previous cesarean section  x 2 complicating pregnancy, antepartum condition or complication 01/14/2015  . Uterine rupture in previous pregnancy resulting in IUFD at 37 weeks 01/14/2015  . Cellulitis and abscess 02/25/2014  . Abscess of left breast s/p I&D 2/5 & 02/04/2014 02/02/2014        Plan:     Initial labs drawn. Prenatal vitamins. Problem list reviewed and updated. Genetic Screening discussed First Screen: ordered.  Ultrasound discussed; fetal survey: to be ordered later. Patient aware that she will deliver at 36 weeks via cesarean section for this pregnancy or earlier if indicated. Follow up in 4 weeks. Routine obstetric precautions  reviewed. The nature of  [facility] with multiple MDs and other Advanced Practitioners was explained to patient; also emphasized that residents, students are part of our team.  Tereso NewcomerANYANWU,UGONNA A, MD 01/14/2015

## 2015-01-14 NOTE — Progress Notes (Signed)
Nuchal Translucency 02/01/15 @ 8a with [facility].

## 2015-01-14 NOTE — Patient Instructions (Signed)
Return to clinic for any obstetric concerns or go to [facility] for evaluation First Trimester of Pregnancy The first trimester of pregnancy is from week 1 until the end of week 12 (months 1 through 3). A week after a sperm fertilizes an egg, the egg will implant on the wall of the uterus. This embryo will begin to develop into a baby. Genes from you and your partner are forming the baby. The female genes determine whether the baby is a boy or a girl. At 6-8 weeks, the eyes and face are formed, and the heartbeat can be seen on ultrasound. At the end of 12 weeks, all the baby's organs are formed.  Now that you are pregnant, you will want to do everything you can to have a healthy baby. Two of the most important things are to get good prenatal care and to follow your health care provider's instructions. Prenatal care is all the medical care you receive before the baby's birth. This care will help prevent, find, and treat any problems during the pregnancy and childbirth. BODY CHANGES Your body goes through many changes during pregnancy. The changes vary from woman to woman.   You may gain or lose a couple of pounds at first.  You may feel sick to your stomach (nauseous) and throw up (vomit). If the vomiting is uncontrollable, call your health care provider.  You may tire easily.  You may develop headaches that can be relieved by medicines approved by your health care provider.  You may urinate more often. Painful urination may mean you have a bladder infection.  You may develop heartburn as a result of your pregnancy.  You may develop constipation because certain hormones are causing the muscles that push waste through your intestines to slow down.  You may develop hemorrhoids or swollen, bulging veins (varicose veins).  Your breasts may begin to grow larger and become tender. Your nipples may stick out more, and the tissue that surrounds them (areola) may become darker.  Your gums may bleed and may be  sensitive to brushing and flossing.  Dark spots or blotches (chloasma, mask of pregnancy) may develop on your face. This will likely fade after the baby is born.  Your menstrual periods will stop.  You may have a loss of appetite.  You may develop cravings for certain kinds of food.  You may have changes in your emotions from day to day, such as being excited to be pregnant or being concerned that something may go wrong with the pregnancy and baby.  You may have more vivid and strange dreams.  You may have changes in your hair. These can include thickening of your hair, rapid growth, and changes in texture. Some women also have hair loss during or after pregnancy, or hair that feels dry or thin. Your hair will most likely return to normal after your baby is born. WHAT TO EXPECT AT YOUR PRENATAL VISITS During a routine prenatal visit:  You will be weighed to make sure you and the baby are growing normally.  Your blood pressure will be taken.  Your abdomen will be measured to track your baby's growth.  The fetal heartbeat will be listened to starting around week 10 or 12 of your pregnancy.  Test results from any previous visits will be discussed. Your health care provider may ask you:  How you are feeling.  If you are feeling the baby move.  If you have had any abnormal symptoms, such as leaking fluid, bleeding,   severe headaches, or abdominal cramping.  If you have any questions. Other tests that may be performed during your first trimester include:  Blood tests to find your blood type and to check for the presence of any previous infections. They will also be used to check for low iron levels (anemia) and Rh antibodies. Later in the pregnancy, blood tests for diabetes will be done along with other tests if problems develop.  Urine tests to check for infections, diabetes, or protein in the urine.  An ultrasound to confirm the proper growth and development of the baby.  An  amniocentesis to check for possible genetic problems.  Fetal screens for spina bifida and Down syndrome.  You may need other tests to make sure you and the baby are doing well. HOME CARE INSTRUCTIONS  Medicines  Follow your health care provider's instructions regarding medicine use. Specific medicines may be either safe or unsafe to take during pregnancy.  Take your prenatal vitamins as directed.  If you develop constipation, try taking a stool softener if your health care provider approves. Diet  Eat regular, well-balanced meals. Choose a variety of foods, such as meat or vegetable-based protein, fish, milk and low-fat dairy products, vegetables, fruits, and whole grain breads and cereals. Your health care provider will help you determine the amount of weight gain that is right for you.  Avoid raw meat and uncooked cheese. These carry germs that can cause birth defects in the baby.  Eating four or five small meals rather than three large meals a day may help relieve nausea and vomiting. If you start to feel nauseous, eating a few soda crackers can be helpful. Drinking liquids between meals instead of during meals also seems to help nausea and vomiting.  If you develop constipation, eat more high-fiber foods, such as fresh vegetables or fruit and whole grains. Drink enough fluids to keep your urine clear or pale yellow. Activity and Exercise  Exercise only as directed by your health care provider. Exercising will help you:  Control your weight.  Stay in shape.  Be prepared for labor and delivery.  Experiencing pain or cramping in the lower abdomen or low back is a good sign that you should stop exercising. Check with your health care provider before continuing normal exercises.  Try to avoid standing for long periods of time. Move your legs often if you must stand in one place for a long time.  Avoid heavy lifting.  Wear low-heeled shoes, and practice good posture.  You may  continue to have sex unless your health care provider directs you otherwise. Relief of Pain or Discomfort  Wear a good support bra for breast tenderness.   Take warm sitz baths to soothe any pain or discomfort caused by hemorrhoids. Use hemorrhoid cream if your health care provider approves.   Rest with your legs elevated if you have leg cramps or low back pain.  If you develop varicose veins in your legs, wear support hose. Elevate your feet for 15 minutes, 3-4 times a day. Limit salt in your diet. Prenatal Care  Schedule your prenatal visits by the twelfth week of pregnancy. They are usually scheduled monthly at first, then more often in the last 2 months before delivery.  Write down your questions. Take them to your prenatal visits.  Keep all your prenatal visits as directed by your health care provider. Safety  Wear your seat belt at all times when driving.  Make a list of emergency phone numbers, including   numbers for family, friends, the hospital, and police and fire departments. General Tips  Ask your health care provider for a referral to a local prenatal education class. Begin classes no later than at the beginning of month 6 of your pregnancy.  Ask for help if you have counseling or nutritional needs during pregnancy. Your health care provider can offer advice or refer you to specialists for help with various needs.  Do not use hot tubs, steam rooms, or saunas.  Do not douche or use tampons or scented sanitary pads.  Do not cross your legs for long periods of time.  Avoid cat litter boxes and soil used by cats. These carry germs that can cause birth defects in the baby and possibly loss of the fetus by miscarriage or stillbirth.  Avoid all smoking, herbs, alcohol, and medicines not prescribed by your health care provider. Chemicals in these affect the formation and growth of the baby.  Schedule a dentist appointment. At home, brush your teeth with a soft toothbrush  and be gentle when you floss. SEEK MEDICAL CARE IF:   You have dizziness.  You have mild pelvic cramps, pelvic pressure, or nagging pain in the abdominal area.  You have persistent nausea, vomiting, or diarrhea.  You have a bad smelling vaginal discharge.  You have pain with urination.  You notice increased swelling in your face, hands, legs, or ankles. SEEK IMMEDIATE MEDICAL CARE IF:   You have a fever.  You are leaking fluid from your vagina.  You have spotting or bleeding from your vagina.  You have severe abdominal cramping or pain.  You have rapid weight gain or loss.  You vomit blood or material that looks like coffee grounds.  You are exposed to German measles and have never had them.  You are exposed to fifth disease or chickenpox.  You develop a severe headache.  You have shortness of breath.  You have any kind of trauma, such as from a fall or a car accident. Document Released: 11/21/2001 Document Revised: 04/13/2014 Document Reviewed: 10/07/2013 ExitCare Patient Information 2015 [employer]. This information is not intended to replace advice given to you by your health care provider. Make sure you discuss any questions you have with your health care provider.  

## 2015-01-14 NOTE — Progress Notes (Signed)
Here for first visit. Given new pregnancy information .Early GTT due to history GDM.

## 2015-01-15 ENCOUNTER — Telehealth: Payer: Self-pay

## 2015-01-15 LAB — PRENATAL PROFILE (SOLSTAS)
Antibody Screen: NEGATIVE
Basophils Absolute: 0 10*3/uL (ref 0.0–0.1)
Basophils Relative: 0 % (ref 0–1)
EOS PCT: 1 % (ref 0–5)
Eosinophils Absolute: 0.1 10*3/uL (ref 0.0–0.7)
HCT: 38.5 % (ref 36.0–46.0)
HIV 1&2 Ab, 4th Generation: NONREACTIVE
Hemoglobin: 12.4 g/dL (ref 12.0–15.0)
Hepatitis B Surface Ag: NEGATIVE
LYMPHS ABS: 1.3 10*3/uL (ref 0.7–4.0)
LYMPHS PCT: 20 % (ref 12–46)
MCH: 28.2 pg (ref 26.0–34.0)
MCHC: 32.2 g/dL (ref 30.0–36.0)
MCV: 87.7 fL (ref 78.0–100.0)
MONOS PCT: 5 % (ref 3–12)
MPV: 12.2 fL (ref 8.6–12.4)
Monocytes Absolute: 0.3 10*3/uL (ref 0.1–1.0)
Neutro Abs: 4.7 10*3/uL (ref 1.7–7.7)
Neutrophils Relative %: 74 % (ref 43–77)
PLATELETS: 214 10*3/uL (ref 150–400)
RBC: 4.39 MIL/uL (ref 3.87–5.11)
RDW: 14.6 % (ref 11.5–15.5)
Rh Type: POSITIVE
Rubella: 4.1 Index — ABNORMAL HIGH (ref <0.90)
WBC: 6.3 10*3/uL (ref 4.0–10.5)

## 2015-01-15 LAB — GLUCOSE TOLERANCE, 1 HOUR (50G) W/O FASTING: Glucose, 1 Hour GTT: 198 mg/dL — ABNORMAL HIGH (ref 70–140)

## 2015-01-15 NOTE — Telephone Encounter (Signed)
Called patient and informed her of results and need for diabetic education. Patient verbalized understanding and states she can come in at 0900 on 01/18/15. Patient added to schedule. No questions or concerns at this time.

## 2015-01-15 NOTE — Telephone Encounter (Signed)
-----   Message from Tereso NewcomerUgonna A Anyanwu, MD sent at 01/15/2015  7:59 AM EST ----- Patient is diabetic.  Needs appointment to meet with DM educator, nutrition etc soon then OB visit one week after she gets her testing supplies.  Please call to inform patient of results and recommendations.

## 2015-01-16 ENCOUNTER — Encounter: Payer: Self-pay | Admitting: Obstetrics & Gynecology

## 2015-01-16 DIAGNOSIS — O2340 Unspecified infection of urinary tract in pregnancy, unspecified trimester: Secondary | ICD-10-CM

## 2015-01-16 DIAGNOSIS — B951 Streptococcus, group B, as the cause of diseases classified elsewhere: Secondary | ICD-10-CM | POA: Insufficient documentation

## 2015-01-16 LAB — PRESCRIPTION MONITORING PROFILE (19 PANEL)
Amphetamine/Meth: NEGATIVE ng/mL
BARBITURATE SCREEN, URINE: NEGATIVE ng/mL
Benzodiazepine Screen, Urine: NEGATIVE ng/mL
Buprenorphine, Urine: NEGATIVE ng/mL
CREATININE, URINE: 108.57 mg/dL (ref >20.0)
Cannabinoid Scrn, Ur: NEGATIVE ng/mL
Carisoprodol, Urine: NEGATIVE ng/mL
Cocaine Metabolites: NEGATIVE ng/mL
ECSTASY: NEGATIVE ng/mL
FENTANYL URINE: NEGATIVE ng/mL
MEPERIDINE UR: NEGATIVE ng/mL
METHADONE SCREEN, URINE: NEGATIVE ng/mL
Methaqualone: NEGATIVE ng/mL
NITRITES URINE, INITIAL: NEGATIVE ug/mL
OXYCODONE SCRN UR: NEGATIVE ng/mL
Opiate Screen, Urine: NEGATIVE ng/mL
PHENCYCLIDINE, UR: NEGATIVE ng/mL
PROPOXYPHENE: NEGATIVE ng/mL
Tapentadol, urine: NEGATIVE ng/mL
Tramadol Scrn, Ur: NEGATIVE ng/mL
ZOLPIDEM, URINE: NEGATIVE ng/mL
pH, Initial: 7 pH (ref 4.5–8.9)

## 2015-01-16 LAB — CULTURE, OB URINE

## 2015-01-16 MED ORDER — AMOXICILLIN 500 MG PO CAPS: 500.0000 mg | ORAL_CAPSULE | Freq: Three times a day (TID) | ORAL | Status: DC

## 2015-01-16 NOTE — Addendum Note (Signed)
Addended by: Jaynie CollinsANYANWU, Sundra Haddix A on: 01/16/2015 10:51 AM   Modules accepted: Orders

## 2015-01-18 ENCOUNTER — Ambulatory Visit: Payer: BLUE CROSS/BLUE SHIELD | Admitting: *Deleted

## 2015-01-18 ENCOUNTER — Encounter: Payer: BLUE CROSS/BLUE SHIELD | Attending: Obstetrics & Gynecology | Admitting: *Deleted

## 2015-01-18 VITALS — Wt 132.4 lb

## 2015-01-18 DIAGNOSIS — O24419 Gestational diabetes mellitus in pregnancy, unspecified control: Secondary | ICD-10-CM | POA: Insufficient documentation

## 2015-01-18 DIAGNOSIS — Z713 Dietary counseling and surveillance: Secondary | ICD-10-CM | POA: Diagnosis not present

## 2015-01-18 MED ORDER — ACCU-CHEK FASTCLIX LANCETS MISC: 1.0000 | Freq: Four times a day (QID) | Status: DC

## 2015-01-18 MED ORDER — GLUCOSE BLOOD VI STRP
ORAL_STRIP | Status: AC
Start: 2015-01-18 — End: ?

## 2015-01-18 MED ORDER — GLUCOSE BLOOD VI STRP: ORAL_STRIP | Status: DC

## 2015-01-18 MED ORDER — BAYER CONTOUR NEXT EZ W/DEVICE KIT: 1.0000 | PACK | Freq: Four times a day (QID) | Status: AC

## 2015-01-18 NOTE — Progress Notes (Signed)
Nutrition note: GDM diet education Pt is a newly diagnosed GDM pt, who stated she had GDM during her 1st pregnancy. Pt has gained 6.4# @ 1286w4d, which is > expected. Pt reports eating 3 meals & 2-3 snacks/d. Pt is taking a PNV. Pt reports no N/V or heartburn. NKFA. Pt reports walking @ school most days. Pt received verbal & written education about GDM diet. Encouraged increasing physical activity. Discussed wt gain goals of 25-35# or 1#/wk in 2nd & 3rd trimester. Pt agrees to follow GDM diet with 3 meals & 2-3 snacks/d with proper CHO/ protein combination. Pt has WIC & plans to BF. F/u in 4-6 wks Blondell RevealLaura Lena Fieldhouse, MS, RD, LDN, [facility]

## 2015-01-18 NOTE — Progress Notes (Signed)
  Patient was seen on 01/18/15 for Gestational Diabetes self-management . The following learning objectives were met by the patient :   States the definition of Gestational Diabetes  States when to check blood glucose levels  Demonstrates proper blood glucose monitoring techniques  States the effect of stress and exercise on blood glucose levels  Plan:  Consider  increasing your activity level by walking daily as tolerated Begin checking BG before breakfast and 2 hours after first bit of breakfast, lunch and dinner after  as directed by MD  Take medication  as directed by MD  Blood glucose monitor given: Accu Chek Nano BG Monitoring Kit Lot # [id number]   Exp: 08/11/15 Blood glucose reading: 160m/dl  2hpp  Patient instructed to monitor glucose levels: FBS: 60 - <90 2 hour: <120  Patient received the following handouts:  Nutrition Diabetes and Pregnancy  Carbohydrate Counting List  Patient will be seen for follow-up as needed.

## 2015-01-19 LAB — CYTOLOGY - PAP

## 2015-01-29 ENCOUNTER — Other Ambulatory Visit (HOSPITAL_COMMUNITY): Payer: BLUE CROSS/BLUE SHIELD

## 2015-02-01 ENCOUNTER — Ambulatory Visit (HOSPITAL_COMMUNITY): Payer: BLUE CROSS/BLUE SHIELD

## 2015-02-01 ENCOUNTER — Other Ambulatory Visit (HOSPITAL_COMMUNITY): Payer: BLUE CROSS/BLUE SHIELD

## 2015-02-02 ENCOUNTER — Encounter: Payer: Self-pay | Admitting: Advanced Practice Midwife

## 2015-02-11 ENCOUNTER — Ambulatory Visit (INDEPENDENT_AMBULATORY_CARE_PROVIDER_SITE_OTHER): Payer: BLUE CROSS/BLUE SHIELD | Admitting: Family

## 2015-02-11 VITALS — BP 107/58 | HR 68 | Wt 132.1 lb

## 2015-02-11 DIAGNOSIS — O34219 Maternal care for unspecified type scar from previous cesarean delivery: Secondary | ICD-10-CM

## 2015-02-11 DIAGNOSIS — O0992 Supervision of high risk pregnancy, unspecified, second trimester: Secondary | ICD-10-CM

## 2015-02-11 DIAGNOSIS — O3421 Maternal care for scar from previous cesarean delivery: Secondary | ICD-10-CM

## 2015-02-11 DIAGNOSIS — O24419 Gestational diabetes mellitus in pregnancy, unspecified control: Secondary | ICD-10-CM

## 2015-02-11 LAB — POCT URINALYSIS DIP (DEVICE)
Bilirubin Urine: NEGATIVE
Glucose, UA: NEGATIVE mg/dL
Hgb urine dipstick: NEGATIVE
Ketones, ur: NEGATIVE mg/dL
LEUKOCYTES UA: NEGATIVE
NITRITE: NEGATIVE
PROTEIN: NEGATIVE mg/dL
Specific Gravity, Urine: 1.02 (ref 1.005–1.030)
UROBILINOGEN UA: 0.2 mg/dL (ref 0.0–1.0)
pH: 7 (ref 5.0–8.0)

## 2015-02-11 NOTE — Progress Notes (Signed)
Anatomy U/S 03/18/15 @ 730a with Radiology.

## 2015-02-11 NOTE — Progress Notes (Signed)
Reviewed new ob lab results and pap smear (nml).  1 hr 198 > reschedule missed appt with diabetic educator.  Reviewed CBSs - FBS majority 90-100's; PPs majority normal 6/14 abnormal 120-130's.  Pt states eats rice in evening, not utilizing a snack.  Reviewed eating high protein/with slice of whole wheat bread may help to decrease BS in AM.  Patient canceled first screen, declines genetic testing.  Scheduled anatomy ultrasound.

## 2015-02-25 ENCOUNTER — Ambulatory Visit (INDEPENDENT_AMBULATORY_CARE_PROVIDER_SITE_OTHER): Payer: BLUE CROSS/BLUE SHIELD | Admitting: Obstetrics & Gynecology

## 2015-02-25 VITALS — BP 94/58 | HR 72 | Wt 135.8 lb

## 2015-02-25 DIAGNOSIS — O24410 Gestational diabetes mellitus in pregnancy, diet controlled: Secondary | ICD-10-CM

## 2015-02-25 DIAGNOSIS — O34219 Maternal care for unspecified type scar from previous cesarean delivery: Secondary | ICD-10-CM

## 2015-02-25 DIAGNOSIS — O3421 Maternal care for scar from previous cesarean delivery: Secondary | ICD-10-CM

## 2015-02-25 LAB — POCT URINALYSIS DIP (DEVICE)
Bilirubin Urine: NEGATIVE
Glucose, UA: NEGATIVE mg/dL
Hgb urine dipstick: NEGATIVE
Ketones, ur: NEGATIVE mg/dL
LEUKOCYTES UA: NEGATIVE
Nitrite: NEGATIVE
Protein, ur: NEGATIVE mg/dL
SPECIFIC GRAVITY, URINE: 1.02 (ref 1.005–1.030)
Urobilinogen, UA: 0.2 mg/dL (ref 0.0–1.0)
pH: 7 (ref 5.0–8.0)

## 2015-02-25 NOTE — Patient Instructions (Signed)
AFP Maternal This is a routine screen (tests) used to check for fetal abnormalities such as Down syndrome and neural tube defects. Down syndrome is a chromosomal abnormality, sometimes called Trisomy 21. Neural tube defects are serious birth defects. The brain, spinal cord, or their coverings do not develop completely. Women should be tested in the 15th to 20th week of pregnancy. The msAFP screen involves three or four tests that measure substances found in the blood that make the testing better. During development, AFP levels in fetal blood and amniotic fluid rise until about 12 weeks. The levels then gradually fall until birth. AFP is a protein produce by fetal tissue. AFP crosses the placenta and appears in the maternal blood. A baby with an open neural tube defect has an opening in its spine, head, or abdominal wall that allows higher than usual amounts of AFP to pass into the mother's blood. If a screen is positive, more tests are needed to make a diagnosis. These include ultrasound and perhaps amniocentesis (checking the fluid that surrounds the baby). These tests are used to help women and their caregivers make decisions about the management of their pregnancies. In pregnancies where the fetus is carrying the chromosomal defect that results in Down syndrome, the levels of AFP and unconjugated estriol tend to be low and hCG and inhibin A levels high.  PREPARATION FOR TEST Blood is drawn from a vein in your arm usually between the 15th and 20th weeks of pregnancy. Four different tests on your blood are done. These are AFP, hCG, unconjugated estriol, and inhibin A. The combination of tests produces a more accurate result. NORMAL FINDINGS   Adult: less than 40 ng/mL or less than 40 mg/L (SI units).  Child younger than 1 year: less than 30 ng/mL. Ranges are stratified by weeks of gestation and vary among laboratories. Ranges for normal findings may vary among different laboratories and hospitals. You  should always check with your doctor after having lab work or other tests done to discuss the meaning of your test results and whether your values are considered within normal limits. MEANING OF TEST  These are screening tests. Not all fetal abnormalities will give positive test results. Of all women who have positive AFP screening results, only a very small number of them have babies who actually have a neural tube defect or chromosomal abnormality. Your caregiver will go over the test results with you and discuss the importance and meaning of your results, as well as treatment options and the need for additional tests if necessary. OBTAINING THE TEST RESULTS It is your responsibility to obtain your test results. Ask the lab or department performing the test when and how you will get your results. Document Released: 12/19/2004 Document Revised: 04/13/2014 Document Reviewed: 10/31/2008 ExitCare Patient Information 2015 [employer]. This information is not intended to replace advice given to you by your health care provider. Make sure you discuss any questions you have with your health care provider.  

## 2015-02-25 NOTE — Progress Notes (Signed)
Quad screen today, US 03/18/15, all values normal, will cut back to 2/days testing

## 2015-03-01 LAB — AFP, QUAD SCREEN
AFP: 32.6 ng/mL
Curr Gest Age: 16 wks.days
HCG, Total: 31.47 IU/mL
INH: 291.9 pg/mL
INTERPRETATION-AFP: NEGATIVE
MOM FOR INH: 1.57
MoM for AFP: 0.96
MoM for hCG: 0.73
Open Spina bifida: NEGATIVE
Tri 18 Scr Risk Est: NEGATIVE
Trisomy 18 (Edward) Syndrome Interp.: 1:9410 {titer}
UE3 MOM: 0.98
uE3 Value: 0.82 ng/mL

## 2015-03-18 ENCOUNTER — Ambulatory Visit (HOSPITAL_COMMUNITY)
Admission: RE | Admit: 2015-03-18 | Discharge: 2015-03-18 | Disposition: A | Discharge status: Home / Self Care | Payer: BLUE CROSS/BLUE SHIELD | Source: Ambulatory Visit | Attending: Family | Admitting: Family

## 2015-03-18 ENCOUNTER — Encounter (HOSPITAL_COMMUNITY): Payer: Self-pay

## 2015-03-18 DIAGNOSIS — O0992 Supervision of high risk pregnancy, unspecified, second trimester: Secondary | ICD-10-CM | POA: Insufficient documentation

## 2015-03-18 DIAGNOSIS — O09522 Supervision of elderly multigravida, second trimester: Secondary | ICD-10-CM | POA: Diagnosis not present

## 2015-03-18 DIAGNOSIS — O09292 Supervision of pregnancy with other poor reproductive or obstetric history, second trimester: Secondary | ICD-10-CM | POA: Diagnosis not present

## 2015-03-18 DIAGNOSIS — O34219 Maternal care for unspecified type scar from previous cesarean delivery: Secondary | ICD-10-CM | POA: Insufficient documentation

## 2015-03-18 DIAGNOSIS — O24410 Gestational diabetes mellitus in pregnancy, diet controlled: Secondary | ICD-10-CM | POA: Insufficient documentation

## 2015-03-18 DIAGNOSIS — Z3A19 19 weeks gestation of pregnancy: Secondary | ICD-10-CM | POA: Diagnosis not present

## 2015-03-18 DIAGNOSIS — Z36 Encounter for antenatal screening of mother: Secondary | ICD-10-CM | POA: Diagnosis not present

## 2015-03-18 DIAGNOSIS — O09299 Supervision of pregnancy with other poor reproductive or obstetric history, unspecified trimester: Secondary | ICD-10-CM | POA: Insufficient documentation

## 2015-03-18 DIAGNOSIS — O09529 Supervision of elderly multigravida, unspecified trimester: Secondary | ICD-10-CM | POA: Insufficient documentation

## 2015-03-18 DIAGNOSIS — O3421 Maternal care for scar from previous cesarean delivery: Secondary | ICD-10-CM | POA: Insufficient documentation

## 2015-03-25 ENCOUNTER — Ambulatory Visit (INDEPENDENT_AMBULATORY_CARE_PROVIDER_SITE_OTHER): Payer: BLUE CROSS/BLUE SHIELD | Admitting: Family

## 2015-03-25 VITALS — BP 99/66 | HR 84 | Temp 98.0°F | Wt 136.1 lb

## 2015-03-25 DIAGNOSIS — O34219 Maternal care for unspecified type scar from previous cesarean delivery: Secondary | ICD-10-CM

## 2015-03-25 DIAGNOSIS — Z3492 Encounter for supervision of normal pregnancy, unspecified, second trimester: Secondary | ICD-10-CM

## 2015-03-25 DIAGNOSIS — O3421 Maternal care for scar from previous cesarean delivery: Secondary | ICD-10-CM

## 2015-03-25 LAB — POCT URINALYSIS DIP (DEVICE)
Bilirubin Urine: NEGATIVE
Glucose, UA: NEGATIVE mg/dL
HGB URINE DIPSTICK: NEGATIVE
Ketones, ur: NEGATIVE mg/dL
Leukocytes, UA: NEGATIVE
Nitrite: NEGATIVE
PH: 6.5 (ref 5.0–8.0)
PROTEIN: NEGATIVE mg/dL
SPECIFIC GRAVITY, URINE: 1.025 (ref 1.005–1.030)
UROBILINOGEN UA: 0.2 mg/dL (ref 0.0–1.0)

## 2015-03-25 NOTE — Progress Notes (Signed)
Doing well; no questions or concerns. Reviewed ultrasound results left echogenic focus, otherwise normal.  CBGs overall well; FBS only 3/31 abnormal, B 7/31 abnl, L 9/31 abnl, D 13/31 abnl.  Pt declines being seen by diabetic counselor.  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

## 2015-04-15 ENCOUNTER — Ambulatory Visit (INDEPENDENT_AMBULATORY_CARE_PROVIDER_SITE_OTHER): Payer: BLUE CROSS/BLUE SHIELD | Admitting: Family

## 2015-04-15 VITALS — BP 105/61 | HR 93 | Wt 140.0 lb

## 2015-04-15 DIAGNOSIS — O24410 Gestational diabetes mellitus in pregnancy, diet controlled: Secondary | ICD-10-CM

## 2015-04-15 DIAGNOSIS — O34219 Maternal care for unspecified type scar from previous cesarean delivery: Secondary | ICD-10-CM

## 2015-04-15 DIAGNOSIS — O0992 Supervision of high risk pregnancy, unspecified, second trimester: Secondary | ICD-10-CM

## 2015-04-15 DIAGNOSIS — O3421 Maternal care for scar from previous cesarean delivery: Secondary | ICD-10-CM

## 2015-04-15 LAB — GLUCOSE, CAPILLARY: Glucose-Capillary: 90 mg/dL (ref 70–99)

## 2015-04-15 LAB — POCT URINALYSIS DIP (DEVICE)
Bilirubin Urine: NEGATIVE
Glucose, UA: NEGATIVE mg/dL
HGB URINE DIPSTICK: NEGATIVE
Ketones, ur: NEGATIVE mg/dL
Leukocytes, UA: NEGATIVE
NITRITE: NEGATIVE
PH: 7 (ref 5.0–8.0)
PROTEIN: NEGATIVE mg/dL
Specific Gravity, Urine: >=1.03 (ref 1.005–1.030)
UROBILINOGEN UA: 0.2 mg/dL (ref 0.0–1.0)

## 2015-04-15 NOTE — Progress Notes (Deleted)
Growth U/S with [facility] 04/23/15 @ 815a.

## 2015-04-15 NOTE — Progress Notes (Incomplete)
Growth U/S 05/07/15 @ 830a with [facility].

## 2015-04-15 NOTE — Progress Notes (Signed)
Did not bring log today.  Reports FBS < 90, some of the dinner are >130.  Random CBG today - 90.  Explained the importance of bringing in log to help us determine course of action needed.  Pt will return next week for appt to review log.

## 2015-04-22 ENCOUNTER — Encounter: Payer: Self-pay | Admitting: Obstetrics and Gynecology

## 2015-04-22 ENCOUNTER — Ambulatory Visit (INDEPENDENT_AMBULATORY_CARE_PROVIDER_SITE_OTHER): Payer: BLUE CROSS/BLUE SHIELD | Admitting: Obstetrics and Gynecology

## 2015-04-22 VITALS — BP 99/54 | HR 79 | Temp 97.9°F | Wt 139.6 lb

## 2015-04-22 DIAGNOSIS — B951 Streptococcus, group B, as the cause of diseases classified elsewhere: Secondary | ICD-10-CM

## 2015-04-22 DIAGNOSIS — O3421 Maternal care for scar from previous cesarean delivery: Secondary | ICD-10-CM

## 2015-04-22 DIAGNOSIS — O2341 Unspecified infection of urinary tract in pregnancy, first trimester: Secondary | ICD-10-CM

## 2015-04-22 DIAGNOSIS — O34219 Maternal care for unspecified type scar from previous cesarean delivery: Secondary | ICD-10-CM

## 2015-04-22 DIAGNOSIS — S3769XS Other injury of uterus, sequela: Secondary | ICD-10-CM

## 2015-04-22 DIAGNOSIS — O24410 Gestational diabetes mellitus in pregnancy, diet controlled: Secondary | ICD-10-CM

## 2015-04-22 DIAGNOSIS — O0992 Supervision of high risk pregnancy, unspecified, second trimester: Secondary | ICD-10-CM

## 2015-04-22 LAB — POCT URINALYSIS DIP (DEVICE)
BILIRUBIN URINE: NEGATIVE
Glucose, UA: NEGATIVE mg/dL
Hgb urine dipstick: NEGATIVE
KETONES UR: NEGATIVE mg/dL
LEUKOCYTES UA: NEGATIVE
NITRITE: NEGATIVE
Protein, ur: NEGATIVE mg/dL
Specific Gravity, Urine: 1.02 (ref 1.005–1.030)
Urobilinogen, UA: 0.2 mg/dL (ref 0.0–1.0)
pH: 7 (ref 5.0–8.0)

## 2015-04-22 NOTE — Progress Notes (Signed)
Patient is doing well without complaints. FM/PTL precautions reviewed. CBGs reviewed and all within range. Encouraged to continue her diet and to incorporate exercise (30 min walking daily at the minimum). Patient scheduled for growth ultrasound on 5/27

## 2015-04-22 NOTE — Progress Notes (Signed)
No complaints today.

## 2015-04-23 ENCOUNTER — Ambulatory Visit (HOSPITAL_COMMUNITY): Payer: BLUE CROSS/BLUE SHIELD

## 2015-05-07 ENCOUNTER — Ambulatory Visit (HOSPITAL_COMMUNITY)
Admission: RE | Admit: 2015-05-07 | Discharge: 2015-05-07 | Disposition: A | Discharge status: Home / Self Care | Payer: BLUE CROSS/BLUE SHIELD | Source: Ambulatory Visit | Attending: Family | Admitting: Family

## 2015-05-07 DIAGNOSIS — O24410 Gestational diabetes mellitus in pregnancy, diet controlled: Secondary | ICD-10-CM

## 2015-05-13 ENCOUNTER — Ambulatory Visit (INDEPENDENT_AMBULATORY_CARE_PROVIDER_SITE_OTHER): Payer: BLUE CROSS/BLUE SHIELD | Admitting: Family

## 2015-05-13 VITALS — BP 100/60 | HR 82 | Temp 98.0°F | Wt 143.1 lb

## 2015-05-13 DIAGNOSIS — O0992 Supervision of high risk pregnancy, unspecified, second trimester: Secondary | ICD-10-CM

## 2015-05-13 DIAGNOSIS — O24410 Gestational diabetes mellitus in pregnancy, diet controlled: Secondary | ICD-10-CM

## 2015-05-13 LAB — CBC
HEMATOCRIT: 34.5 % — AB (ref 36.0–46.0)
HEMOGLOBIN: 10.9 g/dL — AB (ref 12.0–15.0)
MCH: 28.8 pg (ref 26.0–34.0)
MCHC: 31.6 g/dL (ref 30.0–36.0)
MCV: 91 fL (ref 78.0–100.0)
MPV: 11.7 fL (ref 8.6–12.4)
Platelets: 173 10*3/uL (ref 150–400)
RBC: 3.79 MIL/uL — ABNORMAL LOW (ref 3.87–5.11)
RDW: 14.3 % (ref 11.5–15.5)
WBC: 7.2 10*3/uL (ref 4.0–10.5)

## 2015-05-13 LAB — POCT URINALYSIS DIP (DEVICE)
Bilirubin Urine: NEGATIVE
Glucose, UA: NEGATIVE mg/dL
HGB URINE DIPSTICK: NEGATIVE
Ketones, ur: NEGATIVE mg/dL
LEUKOCYTES UA: NEGATIVE
Nitrite: NEGATIVE
PH: 7 (ref 5.0–8.0)
PROTEIN: NEGATIVE mg/dL
Specific Gravity, Urine: 1.02 (ref 1.005–1.030)
UROBILINOGEN UA: 0.2 mg/dL (ref 0.0–1.0)

## 2015-05-13 NOTE — Progress Notes (Signed)
FBS 75-97 (2/10) PBK 105-127 (5/11) PL 107-135 ((3/11) PD 119-131 (10/11).  Pt recognizes that she's eating rice and extra carbs with meals.  Desires to try and decrease intake before adding medication.  Also recommended adding exercise.  Reviewed ultrasound results - nml, growth 51%ile.

## 2015-05-14 LAB — HIV ANTIBODY (ROUTINE TESTING W REFLEX): HIV 1&2 Ab, 4th Generation: NONREACTIVE

## 2015-05-14 LAB — RPR

## 2015-05-27 ENCOUNTER — Ambulatory Visit (INDEPENDENT_AMBULATORY_CARE_PROVIDER_SITE_OTHER): Payer: BLUE CROSS/BLUE SHIELD | Admitting: Obstetrics and Gynecology

## 2015-05-27 ENCOUNTER — Encounter: Payer: Self-pay | Admitting: Obstetrics and Gynecology

## 2015-05-27 VITALS — BP 92/56 | HR 75 | Temp 98.0°F | Wt 143.9 lb

## 2015-05-27 DIAGNOSIS — Z23 Encounter for immunization: Secondary | ICD-10-CM

## 2015-05-27 DIAGNOSIS — O2341 Unspecified infection of urinary tract in pregnancy, first trimester: Secondary | ICD-10-CM

## 2015-05-27 DIAGNOSIS — O99820 Streptococcus B carrier state complicating pregnancy: Secondary | ICD-10-CM

## 2015-05-27 DIAGNOSIS — S3769XS Other injury of uterus, sequela: Secondary | ICD-10-CM

## 2015-05-27 DIAGNOSIS — O0992 Supervision of high risk pregnancy, unspecified, second trimester: Secondary | ICD-10-CM

## 2015-05-27 DIAGNOSIS — O34219 Maternal care for unspecified type scar from previous cesarean delivery: Secondary | ICD-10-CM

## 2015-05-27 DIAGNOSIS — O3421 Maternal care for scar from previous cesarean delivery: Secondary | ICD-10-CM

## 2015-05-27 DIAGNOSIS — O09529 Supervision of elderly multigravida, unspecified trimester: Secondary | ICD-10-CM

## 2015-05-27 DIAGNOSIS — O24410 Gestational diabetes mellitus in pregnancy, diet controlled: Secondary | ICD-10-CM

## 2015-05-27 DIAGNOSIS — B951 Streptococcus, group B, as the cause of diseases classified elsewhere: Secondary | ICD-10-CM

## 2015-05-27 LAB — POCT URINALYSIS DIP (DEVICE)
BILIRUBIN URINE: NEGATIVE
Glucose, UA: NEGATIVE mg/dL
HGB URINE DIPSTICK: NEGATIVE
KETONES UR: NEGATIVE mg/dL
Nitrite: NEGATIVE
Protein, ur: NEGATIVE mg/dL
Specific Gravity, Urine: >=1.03 (ref 1.005–1.030)
Urobilinogen, UA: 0.2 mg/dL (ref 0.0–1.0)
pH: 7 (ref 5.0–8.0)

## 2015-05-27 MED ORDER — TETANUS-DIPHTH-ACELL PERTUSSIS 5-2.5-18.5 LF-MCG/0.5 IM SUSP
0.5000 mL | Freq: Once | INTRAMUSCULAR | Status: AC
  Administered 2015-05-27: 0.5 mL via INTRAMUSCULAR

## 2015-05-27 NOTE — Progress Notes (Signed)
Reviewed tip of week with patient  

## 2015-05-27 NOTE — Progress Notes (Signed)
Subjective:  Brenda Solomon is a 34 y.o. G3P2001 at [redacted]w[redacted]d being seen today for ongoing prenatal care.  Patient reports no complaints.  Contractions: Irritability.  Vag. Bleeding: None. Movement: Present. Denies leaking of fluid.   The following portions of the patient's history were reviewed and updated as appropriate: allergies, current medications, past family history, past medical history, past social history, past surgical history and problem list.   Objective:   Filed Vitals:   05/27/15 0828  BP: 92/56  Pulse: 75  Temp: 98 F (36.7 C)  Weight: 143 lb 14.4 oz (65.273 kg)    Fetal Status: Fetal Heart Rate (bpm): 145 Fundal Height: 29 cm Movement: Present     General:  Alert, oriented and cooperative. Patient is in no acute distress.  Skin: Skin is warm and dry. No rash noted.   Cardiovascular: Normal heart rate noted  Respiratory: Effort and breath sounds normal, no problems with respiration noted  Abdomen: Soft, gravid, appropriate for gestational age. Pain/Pressure: Present     Vaginal: Vag. Bleeding: None.       Cervix: Not evaluated  Extremities: Normal range of motion.  Edema: None  Mental Status: Normal mood and affect. Normal behavior. Normal judgment and thought content.   Urinalysis: Urine Protein: Negative Urine Glucose: Negative  Assessment and Plan:  Pregnancy: G3P2001 at [redacted]w[redacted]d  1. Uterine rupture, sequela Plan for repeat c-section at 36 weeks and twice weekly fetal testing at 32 weeks  2. Supervision of high risk pregnancy in second trimester Doing well Follow up ultrasound ordered. Due to cost, patient desires to only have one more ultrasound. Will schedule ultrasound at 34 weeks  3. Group B streptococcus urinary tract infection affecting pregnancy, antepartum, first trimester   4. Previous cesarean section  x 2 complicating pregnancy, antepartum condition or complication Will schedule repeat c-section at 36 weeks  5. Diet controlled gestational diabetes  mellitus in third trimester CBG- all values within range. Congratulated her on her efforts  6. Advanced maternal age in multigravida, unspecified trimester    Preterm labor symptoms and general obstetric precautions including but not limited to vaginal bleeding, contractions, leaking of fluid and fetal movement were reviewed in detail with the patient.  Please refer to After Visit Summary for other counseling recommendations.   Return in about 2 weeks (around 06/10/2015).   Catalina Antigua, MD

## 2015-06-10 ENCOUNTER — Ambulatory Visit: Payer: BLUE CROSS/BLUE SHIELD | Admitting: Obstetrics & Gynecology

## 2015-06-10 ENCOUNTER — Encounter: Payer: BLUE CROSS/BLUE SHIELD | Admitting: Obstetrics & Gynecology

## 2015-06-10 VITALS — BP 104/62 | HR 86 | Temp 97.9°F | Wt 147.4 lb

## 2015-06-10 DIAGNOSIS — O24410 Gestational diabetes mellitus in pregnancy, diet controlled: Secondary | ICD-10-CM

## 2015-06-10 LAB — POCT URINALYSIS DIP (DEVICE)
Bilirubin Urine: NEGATIVE
GLUCOSE, UA: NEGATIVE mg/dL
HGB URINE DIPSTICK: NEGATIVE
Ketones, ur: NEGATIVE mg/dL
LEUKOCYTES UA: NEGATIVE
Nitrite: NEGATIVE
PROTEIN: NEGATIVE mg/dL
Specific Gravity, Urine: 1.01 (ref 1.005–1.030)
UROBILINOGEN UA: 0.2 mg/dL (ref 0.0–1.0)
pH: 6 (ref 5.0–8.0)

## 2015-06-10 NOTE — Progress Notes (Deleted)
C/o contractions everyday on average about every 30 minutes.

## 2015-06-10 NOTE — Progress Notes (Signed)
See office note

## 2015-06-10 NOTE — Patient Instructions (Signed)
Gestational Diabetes Mellitus Gestational diabetes mellitus, often simply referred to as gestational diabetes, is a type of diabetes that some women develop during pregnancy. In gestational diabetes, the pancreas does not make enough insulin (a hormone), the cells are less responsive to the insulin that is made (insulin resistance), or both.Normally, insulin moves sugars from food into the tissue cells. The tissue cells use the sugars for energy. The lack of insulin or the lack of normal response to insulin causes excess sugars to build up in the blood instead of going into the tissue cells. As a result, high blood sugar (hyperglycemia) develops. The effect of high sugar (glucose) levels can cause many problems.  RISK FACTORS You have an increased chance of developing gestational diabetes if you have a family history of diabetes and also have one or more of the following risk factors:  A body mass index over 30 (obesity).  A previous pregnancy with gestational diabetes.  An older age at the time of pregnancy. If blood glucose levels are kept in the normal range during pregnancy, women can have a healthy pregnancy. If your blood glucose levels are not well controlled, there may be risks to you, your unborn baby (fetus), your labor and delivery, or your newborn baby.  SYMPTOMS  If symptoms are experienced, they are much like symptoms you would normally expect during pregnancy. The symptoms of gestational diabetes include:   Increased thirst (polydipsia).  Increased urination (polyuria).  Increased urination during the night (nocturia).  Weight loss. This weight loss may be rapid.  Frequent, recurring infections.  Tiredness (fatigue).  Weakness.  Vision changes, such as blurred vision.  Fruity smell to your breath.  Abdominal pain. DIAGNOSIS Diabetes is diagnosed when blood glucose levels are increased. Your blood glucose level may be checked by one or more of the following blood  tests:  A fasting blood glucose test. You will not be allowed to eat for at least 8 hours before a blood sample is taken.  A random blood glucose test. Your blood glucose is checked at any time of the day regardless of when you ate.  A hemoglobin A1c blood glucose test. A hemoglobin A1c test provides information about blood glucose control over the previous 3 months.  An oral glucose tolerance test (OGTT). Your blood glucose is measured after you have not eaten (fasted) for 1-3 hours and then after you drink a glucose-containing beverage. Since the hormones that cause insulin resistance are highest at about 24-28 weeks of a pregnancy, an OGTT is usually performed during that time. If you have risk factors for gestational diabetes, your health care provider may test you for gestational diabetes earlier than 24 weeks of pregnancy. TREATMENT   You will need to take diabetes medicine or insulin daily to keep blood glucose levels in the desired range.  You will need to match insulin dosing with exercise and healthy food choices. The treatment goal is to maintain the before-meal (preprandial), bedtime, and overnight blood glucose level at 60-99 mg/dL during pregnancy. The treatment goal is to further maintain peak after-meal blood sugar (postprandial glucose) level at 100-140 mg/dL. HOME CARE INSTRUCTIONS   Have your hemoglobin A1c level checked twice a year.  Perform daily blood glucose monitoring as directed by your health care provider. It is common to perform frequent blood glucose monitoring.  Monitor urine ketones when you are ill and as directed by your health care provider.  Take your diabetes medicine and insulin as directed by your health care provider   to maintain your blood glucose level in the desired range.  Never run out of diabetes medicine or insulin. It is needed every day.  Adjust insulin based on your intake of carbohydrates. Carbohydrates can raise blood glucose levels but  need to be included in your diet. Carbohydrates provide vitamins, minerals, and fiber which are an essential part of a healthy diet. Carbohydrates are found in fruits, vegetables, whole grains, dairy products, legumes, and foods containing added sugars.  Eat healthy foods. Alternate 3 meals with 3 snacks.  Maintain a healthy weight gain. The usual total expected weight gain varies according to your prepregnancy body mass index (BMI).  Carry a medical alert card or wear your medical alert jewelry.  Carry a 15-gram carbohydrate snack with you at all times to treat low blood glucose (hypoglycemia). Some examples of 15-gram carbohydrate snacks include:  Glucose tablets, 3 or 4.  Glucose gel, 15-gram tube.  Raisins, 2 tablespoons (24 g).  Jelly beans, 6.  Animal crackers, 8.  Fruit juice, regular soda, or low-fat milk, 4 ounces (120 mL).  Gummy treats, 9.  Recognize hypoglycemia. Hypoglycemia during pregnancy occurs with blood glucose levels of 60 mg/dL and below. The risk for hypoglycemia increases when fasting or skipping meals, during or after intense exercise, and during sleep. Hypoglycemia symptoms can include:  Tremors or shakes.  Decreased ability to concentrate.  Sweating.  Increased heart rate.  Headache.  Dry mouth.  Hunger.  Irritability.  Anxiety.  Restless sleep.  Altered speech or coordination.  Confusion.  Treat hypoglycemia promptly. If you are alert and able to safely swallow, follow the 15:15 rule:  Take 15-20 grams of rapid-acting glucose or carbohydrate. Rapid-acting options include glucose gel, glucose tablets, or 4 ounces (120 mL) of fruit juice, regular soda, or low-fat milk.  Check your blood glucose level 15 minutes after taking the glucose.  Take 15-20 grams more of glucose if the repeat blood glucose level is still 70 mg/dL or below.  Eat a meal or snack within 1 hour once blood glucose levels return to normal.  Be alert to polyuria  (excess urination) and polydipsia (excess thirst) which are early signs of hyperglycemia. An early awareness of hyperglycemia allows for prompt treatment. Treat hyperglycemia as directed by your health care provider.  Engage in at least 30 minutes of physical activity a day or as directed by your health care provider. Ten minutes of physical activity timed 30 minutes after each meal is encouraged to control postprandial blood glucose levels.  Adjust your insulin dosing and food intake as needed if you start a new exercise or sport.  Follow your sick-day plan at any time you are unable to eat or drink as usual.  Avoid tobacco and alcohol use.  Keep all follow-up visits as directed by your health care provider.  Follow the advice of your health care provider regarding your prenatal and post-delivery (postpartum) appointments, meal planning, exercise, medicines, vitamins, blood tests, other medical tests, and physical activities.  Perform daily skin and foot care. Examine your skin and feet daily for cuts, bruises, redness, nail problems, bleeding, blisters, or sores.  Brush your teeth and gums at least twice a day and floss at least once a day. Follow up with your dentist regularly.  Schedule an eye exam during the first trimester of your pregnancy or as directed by your health care provider.  Share your diabetes management plan with your workplace or school.  Stay up-to-date with immunizations.  Learn to manage stress.    Obtain ongoing diabetes education and support as needed.  Learn about and consider breastfeeding your baby.  You should have your blood sugar level checked 6-12 weeks after delivery. This is done with an oral glucose tolerance test (OGTT). SEEK MEDICAL CARE IF:   You are unable to eat food or drink fluids for more than 6 hours.  You have nausea and vomiting for more than 6 hours.  You have a blood glucose level of 200 mg/dL and you have ketones in your  urine.  There is a change in mental status.  You develop vision problems.  You have a persistent headache.  You have upper abdominal pain or discomfort.  You develop an additional serious illness.  You have diarrhea for more than 6 hours.  You have been sick or have had a fever for a couple of days and are not getting better. SEEK IMMEDIATE MEDICAL CARE IF:   You have difficulty breathing.  You no longer feel the baby moving.  You are bleeding or have discharge from your vagina.  You start having premature contractions or labor. MAKE SURE YOU:  Understand these instructions.  Will watch your condition.  Will get help right away if you are not doing well or get worse. Document Released: 03/05/2001 Document Revised: 04/13/2014 Document Reviewed: 06/25/2012 [employer] Patient Information 2015 [employer]. This information is not intended to replace advice given to you by your health care provider. Make sure you discuss any questions you have with your health care provider.  

## 2015-06-21 ENCOUNTER — Ambulatory Visit (HOSPITAL_COMMUNITY): Payer: BLUE CROSS/BLUE SHIELD

## 2015-06-24 ENCOUNTER — Ambulatory Visit (INDEPENDENT_AMBULATORY_CARE_PROVIDER_SITE_OTHER): Payer: BLUE CROSS/BLUE SHIELD | Admitting: Family Medicine

## 2015-06-24 VITALS — BP 107/69 | HR 85 | Wt 148.2 lb

## 2015-06-24 DIAGNOSIS — B951 Streptococcus, group B, as the cause of diseases classified elsewhere: Secondary | ICD-10-CM

## 2015-06-24 DIAGNOSIS — O0993 Supervision of high risk pregnancy, unspecified, third trimester: Secondary | ICD-10-CM

## 2015-06-24 DIAGNOSIS — O2343 Unspecified infection of urinary tract in pregnancy, third trimester: Secondary | ICD-10-CM

## 2015-06-24 DIAGNOSIS — S3769XS Other injury of uterus, sequela: Secondary | ICD-10-CM

## 2015-06-24 DIAGNOSIS — O0992 Supervision of high risk pregnancy, unspecified, second trimester: Secondary | ICD-10-CM

## 2015-06-24 DIAGNOSIS — O3421 Maternal care for scar from previous cesarean delivery: Secondary | ICD-10-CM

## 2015-06-24 DIAGNOSIS — O09299 Supervision of pregnancy with other poor reproductive or obstetric history, unspecified trimester: Secondary | ICD-10-CM | POA: Diagnosis not present

## 2015-06-24 DIAGNOSIS — O2341 Unspecified infection of urinary tract in pregnancy, first trimester: Secondary | ICD-10-CM

## 2015-06-24 DIAGNOSIS — O24410 Gestational diabetes mellitus in pregnancy, diet controlled: Secondary | ICD-10-CM | POA: Diagnosis not present

## 2015-06-24 DIAGNOSIS — O34219 Maternal care for unspecified type scar from previous cesarean delivery: Secondary | ICD-10-CM

## 2015-06-24 LAB — POCT URINALYSIS DIP (DEVICE)
Bilirubin Urine: NEGATIVE
Glucose, UA: NEGATIVE mg/dL
HGB URINE DIPSTICK: NEGATIVE
Ketones, ur: NEGATIVE mg/dL
LEUKOCYTES UA: NEGATIVE
NITRITE: NEGATIVE
Protein, ur: NEGATIVE mg/dL
SPECIFIC GRAVITY, URINE: 1.02 (ref 1.005–1.030)
Urobilinogen, UA: 0.2 mg/dL (ref 0.0–1.0)
pH: 7 (ref 5.0–8.0)

## 2015-06-24 NOTE — Progress Notes (Signed)
Subjective:  Brenda Solomon is a 34 y.o. G3P2001 at 3268w0d being seen today for ongoing prenatal care.  Patient reports no complaints.  Contractions: Not present.  Vag. Bleeding: None. Movement: Present. Denies leaking of fluid.   IUFD at term- has not had appropriate antenatal testing  The following portions of the patient's history were reviewed and updated as appropriate: allergies, current medications, past family history, past medical history, past social history, past surgical history and problem list.   Objective:   Filed Vitals:   06/24/15 0813  BP: 107/69  Pulse: 85  Weight: 148 lb 3.2 oz (67.223 kg)    Fetal Status: Fetal Heart Rate (bpm): 142   Movement: Present     General:  Alert, oriented and cooperative. Patient is in no acute distress.  Skin: Skin is warm and dry. No rash noted.   Cardiovascular: Normal heart rate noted  Respiratory: Normal respiratory effort, no problems with respiration noted  Abdomen: Soft, gravid, appropriate for gestational age. Pain/Pressure: Absent     Vaginal: Vag. Bleeding: None.       Cervix: Not evaluated        Extremities: Normal range of motion.  Edema: None  Mental Status: Normal mood and affect. Normal behavior. Normal judgment and thought content.   Urinalysis: Urine Protein: Negative Urine Glucose: Negative   GDM: FBS 78-91 PP 105-127, mostly 110's No low BS  Assessment and Plan:  Pregnancy: G3P2001 at 5068w0d  1. Diet controlled gestational diabetes mellitus in third trimester -continue to monitor -US for growth scheduled   2. Uterine rupture, sequela -delivery at 36 wk, planned CS  3. IUFD at 37wks- diagnosed in [facility], suspected abruption and proceeded to CS which diagnosed uterine rupture. -patient did not get the following antenatal testing- US q4 weeks starting at 20 weeks and BPPs weekly starting at 28wk -NST biweekly starting today, scheduled  -Repeat US scheduled for GDM  4. Group B streptococcus urinary tract  infection affecting pregnancy, antepartum, first trimester  5. Previous cesarean section  x 2 complicating pregnancy, antepartum condition or complication  6. Supervision of high risk pregnancy in second trimester Prenatal UTD Discussed contraception today-plans for condoms  Preterm labor symptoms and general obstetric precautions including but not limited to vaginal bleeding, contractions, leaking of fluid and fetal movement were reviewed in detail with the patient.  Please refer to After Visit Summary for other counseling recommendations.   Federico FlakeKimberly Niles Amberly Livas, MD

## 2015-06-24 NOTE — Patient Instructions (Signed)
Gestational Diabetes Mellitus Gestational diabetes mellitus, often simply referred to as gestational diabetes, is a type of diabetes that some women develop during pregnancy. In gestational diabetes, the pancreas does not make enough insulin (a hormone), the cells are less responsive to the insulin that is made (insulin resistance), or both.Normally, insulin moves sugars from food into the tissue cells. The tissue cells use the sugars for energy. The lack of insulin or the lack of normal response to insulin causes excess sugars to build up in the blood instead of going into the tissue cells. As a result, high blood sugar (hyperglycemia) develops. The effect of high sugar (glucose) levels can cause many problems.  RISK FACTORS You have an increased chance of developing gestational diabetes if you have a family history of diabetes and also have one or more of the following risk factors:  A body mass index over 30 (obesity).  A previous pregnancy with gestational diabetes.  An older age at the time of pregnancy. If blood glucose levels are kept in the normal range during pregnancy, women can have a healthy pregnancy. If your blood glucose levels are not well controlled, there may be risks to you, your unborn baby (fetus), your labor and delivery, or your newborn baby.  SYMPTOMS  If symptoms are experienced, they are much like symptoms you would normally expect during pregnancy. The symptoms of gestational diabetes include:   Increased thirst (polydipsia).  Increased urination (polyuria).  Increased urination during the night (nocturia).  Weight loss. This weight loss may be rapid.  Frequent, recurring infections.  Tiredness (fatigue).  Weakness.  Vision changes, such as blurred vision.  Fruity smell to your breath.  Abdominal pain. DIAGNOSIS Diabetes is diagnosed when blood glucose levels are increased. Your blood glucose level may be checked by one or more of the following blood  tests:  A fasting blood glucose test. You will not be allowed to eat for at least 8 hours before a blood sample is taken.  A random blood glucose test. Your blood glucose is checked at any time of the day regardless of when you ate.  A hemoglobin A1c blood glucose test. A hemoglobin A1c test provides information about blood glucose control over the previous 3 months.  An oral glucose tolerance test (OGTT). Your blood glucose is measured after you have not eaten (fasted) for 1-3 hours and then after you drink a glucose-containing beverage. Since the hormones that cause insulin resistance are highest at about 24-28 weeks of a pregnancy, an OGTT is usually performed during that time. If you have risk factors for gestational diabetes, your health care provider may test you for gestational diabetes earlier than 24 weeks of pregnancy. TREATMENT   You will need to take diabetes medicine or insulin daily to keep blood glucose levels in the desired range.  You will need to match insulin dosing with exercise and healthy food choices. The treatment goal is to maintain the before-meal (preprandial), bedtime, and overnight blood glucose level at 60-99 mg/dL during pregnancy. The treatment goal is to further maintain peak after-meal blood sugar (postprandial glucose) level at 100-140 mg/dL. HOME CARE INSTRUCTIONS   Have your hemoglobin A1c level checked twice a year.  Perform daily blood glucose monitoring as directed by your health care provider. It is common to perform frequent blood glucose monitoring.  Monitor urine ketones when you are ill and as directed by your health care provider.  Take your diabetes medicine and insulin as directed by your health care provider   to maintain your blood glucose level in the desired range.  Never run out of diabetes medicine or insulin. It is needed every day.  Adjust insulin based on your intake of carbohydrates. Carbohydrates can raise blood glucose levels but  need to be included in your diet. Carbohydrates provide vitamins, minerals, and fiber which are an essential part of a healthy diet. Carbohydrates are found in fruits, vegetables, whole grains, dairy products, legumes, and foods containing added sugars.  Eat healthy foods. Alternate 3 meals with 3 snacks.  Maintain a healthy weight gain. The usual total expected weight gain varies according to your prepregnancy body mass index (BMI).  Carry a medical alert card or wear your medical alert jewelry.  Carry a 15-gram carbohydrate snack with you at all times to treat low blood glucose (hypoglycemia). Some examples of 15-gram carbohydrate snacks include:  Glucose tablets, 3 or 4.  Glucose gel, 15-gram tube.  Raisins, 2 tablespoons (24 g).  Jelly beans, 6.  Animal crackers, 8.  Fruit juice, regular soda, or low-fat milk, 4 ounces (120 mL).  Gummy treats, 9.  Recognize hypoglycemia. Hypoglycemia during pregnancy occurs with blood glucose levels of 60 mg/dL and below. The risk for hypoglycemia increases when fasting or skipping meals, during or after intense exercise, and during sleep. Hypoglycemia symptoms can include:  Tremors or shakes.  Decreased ability to concentrate.  Sweating.  Increased heart rate.  Headache.  Dry mouth.  Hunger.  Irritability.  Anxiety.  Restless sleep.  Altered speech or coordination.  Confusion.  Treat hypoglycemia promptly. If you are alert and able to safely swallow, follow the 15:15 rule:  Take 15-20 grams of rapid-acting glucose or carbohydrate. Rapid-acting options include glucose gel, glucose tablets, or 4 ounces (120 mL) of fruit juice, regular soda, or low-fat milk.  Check your blood glucose level 15 minutes after taking the glucose.  Take 15-20 grams more of glucose if the repeat blood glucose level is still 70 mg/dL or below.  Eat a meal or snack within 1 hour once blood glucose levels return to normal.  Be alert to polyuria  (excess urination) and polydipsia (excess thirst) which are early signs of hyperglycemia. An early awareness of hyperglycemia allows for prompt treatment. Treat hyperglycemia as directed by your health care provider.  Engage in at least 30 minutes of physical activity a day or as directed by your health care provider. Ten minutes of physical activity timed 30 minutes after each meal is encouraged to control postprandial blood glucose levels.  Adjust your insulin dosing and food intake as needed if you start a new exercise or sport.  Follow your sick-day plan at any time you are unable to eat or drink as usual.  Avoid tobacco and alcohol use.  Keep all follow-up visits as directed by your health care provider.  Follow the advice of your health care provider regarding your prenatal and post-delivery (postpartum) appointments, meal planning, exercise, medicines, vitamins, blood tests, other medical tests, and physical activities.  Perform daily skin and foot care. Examine your skin and feet daily for cuts, bruises, redness, nail problems, bleeding, blisters, or sores.  Brush your teeth and gums at least twice a day and floss at least once a day. Follow up with your dentist regularly.  Schedule an eye exam during the first trimester of your pregnancy or as directed by your health care provider.  Share your diabetes management plan with your workplace or school.  Stay up-to-date with immunizations.  Learn to manage stress.    Obtain ongoing diabetes education and support as needed.  Learn about and consider breastfeeding your baby.  You should have your blood sugar level checked 6-12 weeks after delivery. This is done with an oral glucose tolerance test (OGTT). SEEK MEDICAL CARE IF:   You are unable to eat food or drink fluids for more than 6 hours.  You have nausea and vomiting for more than 6 hours.  You have a blood glucose level of 200 mg/dL and you have ketones in your  urine.  There is a change in mental status.  You develop vision problems.  You have a persistent headache.  You have upper abdominal pain or discomfort.  You develop an additional serious illness.  You have diarrhea for more than 6 hours.  You have been sick or have had a fever for a couple of days and are not getting better. SEEK IMMEDIATE MEDICAL CARE IF:   You have difficulty breathing.  You no longer feel the baby moving.  You are bleeding or have discharge from your vagina.  You start having premature contractions or labor. MAKE SURE YOU:  Understand these instructions.  Will watch your condition.  Will get help right away if you are not doing well or get worse. Document Released: 03/05/2001 Document Revised: 04/13/2014 Document Reviewed: 06/25/2012 [employer] Patient Information 2015 [employer]. This information is not intended to replace advice given to you by your health care provider. Make sure you discuss any questions you have with your health care provider.  

## 2015-06-28 ENCOUNTER — Ambulatory Visit (INDEPENDENT_AMBULATORY_CARE_PROVIDER_SITE_OTHER): Payer: BLUE CROSS/BLUE SHIELD | Admitting: *Deleted

## 2015-06-28 DIAGNOSIS — O09293 Supervision of pregnancy with other poor reproductive or obstetric history, third trimester: Secondary | ICD-10-CM | POA: Diagnosis not present

## 2015-06-28 NOTE — Progress Notes (Signed)
NST performed today was reviewed and was found to be reactive.  Continue recommended antenatal testing and prenatal care.  

## 2015-07-01 ENCOUNTER — Ambulatory Visit (INDEPENDENT_AMBULATORY_CARE_PROVIDER_SITE_OTHER): Payer: BLUE CROSS/BLUE SHIELD | Admitting: Family

## 2015-07-01 ENCOUNTER — Ambulatory Visit (HOSPITAL_COMMUNITY)
Admission: RE | Admit: 2015-07-01 | Discharge: 2015-07-01 | Disposition: A | Discharge status: Home / Self Care | Payer: BLUE CROSS/BLUE SHIELD | Source: Ambulatory Visit | Attending: Obstetrics and Gynecology | Admitting: Obstetrics and Gynecology

## 2015-07-01 VITALS — BP 105/60 | HR 97 | Wt 149.5 lb

## 2015-07-01 DIAGNOSIS — O09293 Supervision of pregnancy with other poor reproductive or obstetric history, third trimester: Secondary | ICD-10-CM

## 2015-07-01 DIAGNOSIS — O24410 Gestational diabetes mellitus in pregnancy, diet controlled: Secondary | ICD-10-CM | POA: Diagnosis present

## 2015-07-01 DIAGNOSIS — O09299 Supervision of pregnancy with other poor reproductive or obstetric history, unspecified trimester: Secondary | ICD-10-CM | POA: Insufficient documentation

## 2015-07-01 LAB — POCT URINALYSIS DIP (DEVICE)
BILIRUBIN URINE: NEGATIVE
Glucose, UA: NEGATIVE mg/dL
HGB URINE DIPSTICK: NEGATIVE
Ketones, ur: NEGATIVE mg/dL
Leukocytes, UA: NEGATIVE
Nitrite: NEGATIVE
PROTEIN: 30 mg/dL — AB
Specific Gravity, Urine: 1.02 (ref 1.005–1.030)
UROBILINOGEN UA: 0.2 mg/dL (ref 0.0–1.0)
pH: 7 (ref 5.0–8.0)

## 2015-07-01 NOTE — Progress Notes (Signed)
Subjective:  Ramiyah Mcclenahan is a 34 y.o. G3P2001 at [redacted]w[redacted]d being seen today for ongoing prenatal care.  Patient reports no complaints.  Contractions: Not present.  Vag. Bleeding: None. Movement: Present. Denies leaking of fluid. Scheduled for growth ultrasound.    The following portions of the patient's history were reviewed and updated as appropriate: allergies, current medications, past family history, past medical history, past social history, past surgical history and problem list.   Objective:   Filed Vitals:   07/01/15 0822  BP: 105/60  Pulse: 97  Weight: 149 lb 8 oz (67.813 kg)    Fetal Status: Fetal Heart Rate (bpm): 150 Fundal Height: 34 cm Movement: Present     General:  Alert, oriented and cooperative. Patient is in no acute distress.  Skin: Skin is warm and dry. No rash noted.   Cardiovascular: Normal heart rate noted  Respiratory: Normal respiratory effort, no problems with respiration noted  Abdomen: Soft, gravid, appropriate for gestational age. Pain/Pressure: Absent     Vaginal: Vag. Bleeding: None.       Cervix: Not evaluated        Extremities: Normal range of motion.  Edema: None  Mental Status: Normal mood and affect. Normal behavior. Normal judgment and thought content.   Urinalysis: Urine Protein: 1+ Urine Glucose: Negative  CBGs - normal other than a few in low 120's  Assessment and Plan:  Pregnancy:   34 y.o. G3P2001 at [redacted]w[redacted]d   Preterm labor symptoms and general obstetric precautions including but not limited to vaginal bleeding, contractions, leaking of fluid and fetal movement were reviewed in detail with the patient. Please refer to After Visit Summary for other counseling recommendations.   BPP for today with growth ultrasound.    Return in about 4 days (around 07/05/2015). for NST and keep scheduled appt in one week.    Eino Farber Kennith Gain, CNM

## 2015-07-05 ENCOUNTER — Ambulatory Visit (INDEPENDENT_AMBULATORY_CARE_PROVIDER_SITE_OTHER): Payer: BLUE CROSS/BLUE SHIELD | Admitting: *Deleted

## 2015-07-05 VITALS — BP 94/61 | HR 99

## 2015-07-05 DIAGNOSIS — O09293 Supervision of pregnancy with other poor reproductive or obstetric history, third trimester: Secondary | ICD-10-CM | POA: Diagnosis not present

## 2015-07-05 DIAGNOSIS — O24419 Gestational diabetes mellitus in pregnancy, unspecified control: Secondary | ICD-10-CM | POA: Diagnosis not present

## 2015-07-08 ENCOUNTER — Encounter: Payer: BLUE CROSS/BLUE SHIELD | Admitting: Obstetrics & Gynecology

## 2015-07-08 ENCOUNTER — Ambulatory Visit (INDEPENDENT_AMBULATORY_CARE_PROVIDER_SITE_OTHER): Payer: BLUE CROSS/BLUE SHIELD | Admitting: Obstetrics & Gynecology

## 2015-07-08 VITALS — BP 99/63 | HR 106 | Wt 148.8 lb

## 2015-07-08 DIAGNOSIS — O09523 Supervision of elderly multigravida, third trimester: Secondary | ICD-10-CM

## 2015-07-08 DIAGNOSIS — S3769XD Other injury of uterus, subsequent encounter: Secondary | ICD-10-CM

## 2015-07-08 DIAGNOSIS — O24419 Gestational diabetes mellitus in pregnancy, unspecified control: Secondary | ICD-10-CM | POA: Diagnosis not present

## 2015-07-08 DIAGNOSIS — O09293 Supervision of pregnancy with other poor reproductive or obstetric history, third trimester: Secondary | ICD-10-CM

## 2015-07-08 DIAGNOSIS — O34219 Maternal care for unspecified type scar from previous cesarean delivery: Secondary | ICD-10-CM

## 2015-07-08 DIAGNOSIS — O3421 Maternal care for scar from previous cesarean delivery: Secondary | ICD-10-CM

## 2015-07-08 LAB — POCT URINALYSIS DIP (DEVICE)
Bilirubin Urine: NEGATIVE
Glucose, UA: >=1000 mg/dL — AB
HGB URINE DIPSTICK: NEGATIVE
Ketones, ur: NEGATIVE mg/dL
Leukocytes, UA: NEGATIVE
Nitrite: NEGATIVE
PROTEIN: NEGATIVE mg/dL
Specific Gravity, Urine: 1.015 (ref 1.005–1.030)
UROBILINOGEN UA: 0.2 mg/dL (ref 0.0–1.0)
pH: 6 (ref 5.0–8.0)

## 2015-07-08 MED ORDER — BETAMETHASONE SOD PHOS & ACET 6 (3-3) MG/ML IJ SUSP
12.0000 mg | Freq: Every day | INTRAMUSCULAR | Status: AC
  Administered 2015-07-08 – 2015-07-09 (×2): 12 mg via INTRAMUSCULAR

## 2015-07-08 NOTE — Patient Instructions (Signed)
Return to clinic for any obstetric concerns or go to [facility] for evaluation  

## 2015-07-08 NOTE — Progress Notes (Signed)
Breastfeeding tip of the week reviewed. 

## 2015-07-08 NOTE — Progress Notes (Signed)
Subjective:  Brenda Solomon is a 34 y.o. G3P2001 at [redacted]w[redacted]d being seen today for ongoing prenatal care.  Patient reports no complaints.  Contractions: Irregular.  Vag. Bleeding: None. Movement: Present. Denies leaking of fluid.   The following portions of the patient's history were reviewed and updated as appropriate: allergies, current medications, past family history, past medical history, past social history, past surgical history and problem list.   Objective:   Filed Vitals:   07/08/15 0846  BP: 99/63  Pulse: 106  Weight: 148 lb 12.8 oz (67.495 kg)    Fetal Status: Fetal Heart Rate (bpm): NST Fundal Height: 35 cm Movement: Present     General:  Alert, oriented and cooperative. Patient is in no acute distress.  Skin: Skin is warm and dry. No rash noted.   Cardiovascular: Normal heart rate noted  Respiratory: Normal respiratory effort, no problems with respiration noted  Abdomen: Soft, gravid, appropriate for gestational age. Pain/Pressure: Present     Vaginal: Vag. Bleeding: None.       Cervix: Not evaluated        Extremities: Normal range of motion.  Edema: None  Mental Status: Normal mood and affect. Normal behavior. Normal judgment and thought content.   Urinalysis: Urine Protein: Negative Urine Glucose: 4+ CBGs: Within normal limits NST performed today was reviewed and was found to be reactive.  AFI normal. Continue recommended antenatal testing and prenatal care.  Assessment and Plan:  Pregnancy: G3P2001 at [redacted]w[redacted]d  1. Gestational diabetes mellitus in third trimester, unspecified diabetic control Good control with diet.   - GC/Chlamydia Probe Amp done today  2. Previous stillbirth or demise, antepartum, third trimester NST performed today was reviewed and was found to be reactive with normal AFI.  Continue recommended antenatal testing and prenatal care. 7/21 EFW 47%, AFI 12 cm. - Amniotic fluid index with NST - GC/Chlamydia Probe Amp  3. Uterine rupture, subsequent  encounter 4. Previous cesarean section  x 2 complicating pregnancy, antepartum condition or complication Scheduled RCS at 36 weeks (07/15/15) with Dr. Erin Fulling. Preoperative instructions given to patient. Will get betamethasone injections today and tomorrow in preparation for delivery at 36 weeks  5. Advanced maternal age in multigravida, third trimester  Preterm labor symptoms and general obstetric precautions including but not limited to vaginal bleeding, contractions, leaking of fluid and fetal movement were reviewed in detail with the patient. Please refer to After Visit Summary for other counseling recommendations.  Return in about 1 day (around 07/09/2015) for Betamethasone injection. 07/12/15 NST only.  07/15/15 Scheduled cesarean section. Tereso Newcomer, MD

## 2015-07-09 ENCOUNTER — Ambulatory Visit (INDEPENDENT_AMBULATORY_CARE_PROVIDER_SITE_OTHER): Payer: BLUE CROSS/BLUE SHIELD | Admitting: General Practice

## 2015-07-09 VITALS — BP 94/57 | HR 85 | Temp 98.4°F | Ht 62.0 in | Wt 150.0 lb

## 2015-07-09 DIAGNOSIS — O4703 False labor before 37 completed weeks of gestation, third trimester: Secondary | ICD-10-CM | POA: Diagnosis not present

## 2015-07-09 LAB — GC/CHLAMYDIA PROBE AMP
CT Probe RNA: NEGATIVE
GC Probe RNA: NEGATIVE

## 2015-07-12 ENCOUNTER — Ambulatory Visit (INDEPENDENT_AMBULATORY_CARE_PROVIDER_SITE_OTHER): Payer: BLUE CROSS/BLUE SHIELD | Admitting: *Deleted

## 2015-07-12 VITALS — BP 94/59 | HR 97

## 2015-07-12 DIAGNOSIS — O09293 Supervision of pregnancy with other poor reproductive or obstetric history, third trimester: Secondary | ICD-10-CM

## 2015-07-12 NOTE — Progress Notes (Signed)
Pt reports decreased FM x 3 days - felt good FM during NST today.  Scheduled for Rpt C/S on 8/4.

## 2015-07-14 ENCOUNTER — Encounter (HOSPITAL_COMMUNITY): Payer: Self-pay

## 2015-07-14 ENCOUNTER — Encounter (HOSPITAL_COMMUNITY)
Admission: RE | Admit: 2015-07-14 | Discharge: 2015-07-14 | Disposition: A | Discharge status: Home / Self Care | Payer: BLUE CROSS/BLUE SHIELD | Source: Ambulatory Visit | Attending: Obstetrics & Gynecology | Admitting: Obstetrics & Gynecology

## 2015-07-14 VITALS — BP 95/70 | HR 88 | Temp 97.3°F | Resp 20 | Ht 62.0 in | Wt 148.0 lb

## 2015-07-14 DIAGNOSIS — O09293 Supervision of pregnancy with other poor reproductive or obstetric history, third trimester: Secondary | ICD-10-CM

## 2015-07-14 DIAGNOSIS — O24419 Gestational diabetes mellitus in pregnancy, unspecified control: Secondary | ICD-10-CM

## 2015-07-14 DIAGNOSIS — S3769XD Other injury of uterus, subsequent encounter: Secondary | ICD-10-CM

## 2015-07-14 DIAGNOSIS — O34219 Maternal care for unspecified type scar from previous cesarean delivery: Secondary | ICD-10-CM

## 2015-07-14 LAB — BASIC METABOLIC PANEL
ANION GAP: 8 (ref 5–15)
BUN: 7 mg/dL (ref 6–20)
CALCIUM: 8.6 mg/dL — AB (ref 8.9–10.3)
CHLORIDE: 102 mmol/L (ref 101–111)
CO2: 21 mmol/L — ABNORMAL LOW (ref 22–32)
Creatinine, Ser: 0.53 mg/dL (ref 0.44–1.00)
GFR calc Af Amer: >60 mL/min (ref >60)
GFR calc non Af Amer: >60 mL/min (ref >60)
Glucose, Bld: 88 mg/dL (ref 65–99)
POTASSIUM: 4.2 mmol/L (ref 3.5–5.1)
SODIUM: 131 mmol/L — AB (ref 135–145)

## 2015-07-14 LAB — CBC
HCT: 36.2 % (ref 36.0–46.0)
HEMOGLOBIN: 12 g/dL (ref 12.0–15.0)
MCH: 29.4 pg (ref 26.0–34.0)
MCHC: 33.1 g/dL (ref 30.0–36.0)
MCV: 88.7 fL (ref 78.0–100.0)
Platelets: 150 10*3/uL (ref 150–400)
RBC: 4.08 MIL/uL (ref 3.87–5.11)
RDW: 15.2 % (ref 11.5–15.5)
WBC: 8.4 10*3/uL (ref 4.0–10.5)

## 2015-07-14 LAB — TYPE AND SCREEN
ABO/RH(D): A POS
ANTIBODY SCREEN: NEGATIVE

## 2015-07-14 MED ORDER — DEXTROSE 5 % IV SOLN
2.0000 g | INTRAVENOUS | Status: AC
  Administered 2015-07-15: 2 g via INTRAVENOUS
  Filled 2015-07-14: qty 2

## 2015-07-14 NOTE — Patient Instructions (Addendum)
   Your procedure is scheduled on:  AUG 4 AT 2:50PM  Enter through the Main Entrance of [facility] at: 1:15PM  Pick up the phone at the desk and dial [phone] and inform us of your arrival.  Please call this number if you have any problems the morning of surgery: [phone]  Remember: Do not eat food after midnight: 6AM DAY OF SURGERY Do not drink clear liquids after: 1030AM DAY OF SURGERY   Do not wear jewelry, make-up, or FINGER nail polish No metal in your hair or on your body. Do not wear lotions, powders, perfumes.  You may wear deodorant.  Do not bring valuables to the hospital.   Leave suitcase in the car. After Surgery it may be brought to your room. For patients being admitted to the hospital, checkout time is 11:00am the day of discharge.

## 2015-07-15 ENCOUNTER — Encounter (HOSPITAL_COMMUNITY)
Admission: RE | Disposition: A | Discharge status: Home / Self Care | Payer: Self-pay | Source: Ambulatory Visit | Attending: Obstetrics & Gynecology

## 2015-07-15 ENCOUNTER — Encounter (HOSPITAL_COMMUNITY): Payer: Self-pay | Admitting: *Deleted

## 2015-07-15 ENCOUNTER — Inpatient Hospital Stay (HOSPITAL_COMMUNITY)
Admission: RE | Admit: 2015-07-15 | Discharge: 2015-07-18 | DRG: 766 | Disposition: A | Discharge status: Home / Self Care | Payer: BLUE CROSS/BLUE SHIELD | Source: Ambulatory Visit | Attending: Obstetrics & Gynecology | Admitting: Obstetrics & Gynecology

## 2015-07-15 ENCOUNTER — Inpatient Hospital Stay (HOSPITAL_COMMUNITY): Payer: BLUE CROSS/BLUE SHIELD | Admitting: Anesthesiology

## 2015-07-15 DIAGNOSIS — Z833 Family history of diabetes mellitus: Secondary | ICD-10-CM | POA: Diagnosis not present

## 2015-07-15 DIAGNOSIS — O3421 Maternal care for scar from previous cesarean delivery: Principal | ICD-10-CM | POA: Diagnosis present

## 2015-07-15 DIAGNOSIS — Z98891 History of uterine scar from previous surgery: Secondary | ICD-10-CM

## 2015-07-15 DIAGNOSIS — O09299 Supervision of pregnancy with other poor reproductive or obstetric history, unspecified trimester: Secondary | ICD-10-CM

## 2015-07-15 DIAGNOSIS — O24420 Gestational diabetes mellitus in childbirth, diet controlled: Secondary | ICD-10-CM | POA: Diagnosis present

## 2015-07-15 DIAGNOSIS — O09523 Supervision of elderly multigravida, third trimester: Secondary | ICD-10-CM | POA: Diagnosis not present

## 2015-07-15 DIAGNOSIS — Z3A36 36 weeks gestation of pregnancy: Secondary | ICD-10-CM | POA: Diagnosis present

## 2015-07-15 DIAGNOSIS — O2340 Unspecified infection of urinary tract in pregnancy, unspecified trimester: Secondary | ICD-10-CM

## 2015-07-15 DIAGNOSIS — O99824 Streptococcus B carrier state complicating childbirth: Secondary | ICD-10-CM | POA: Diagnosis present

## 2015-07-15 DIAGNOSIS — O9989 Other specified diseases and conditions complicating pregnancy, childbirth and the puerperium: Secondary | ICD-10-CM | POA: Diagnosis present

## 2015-07-15 DIAGNOSIS — O34219 Maternal care for unspecified type scar from previous cesarean delivery: Secondary | ICD-10-CM

## 2015-07-15 DIAGNOSIS — O24429 Gestational diabetes mellitus in childbirth, unspecified control: Secondary | ICD-10-CM | POA: Diagnosis not present

## 2015-07-15 DIAGNOSIS — O24410 Gestational diabetes mellitus in pregnancy, diet controlled: Secondary | ICD-10-CM | POA: Diagnosis present

## 2015-07-15 DIAGNOSIS — B951 Streptococcus, group B, as the cause of diseases classified elsewhere: Secondary | ICD-10-CM | POA: Diagnosis present

## 2015-07-15 DIAGNOSIS — O24419 Gestational diabetes mellitus in pregnancy, unspecified control: Secondary | ICD-10-CM | POA: Diagnosis present

## 2015-07-15 DIAGNOSIS — O09293 Supervision of pregnancy with other poor reproductive or obstetric history, third trimester: Secondary | ICD-10-CM

## 2015-07-15 DIAGNOSIS — S3769XA Other injury of uterus, initial encounter: Secondary | ICD-10-CM | POA: Diagnosis present

## 2015-07-15 LAB — GLUCOSE, CAPILLARY
Glucose-Capillary: 107 mg/dL — ABNORMAL HIGH (ref 65–99)
Glucose-Capillary: 90 mg/dL (ref 65–99)

## 2015-07-15 LAB — RPR: RPR Ser Ql: NONREACTIVE

## 2015-07-15 SURGERY — Surgical Case
Anesthesia: Spinal | Site: Abdomen

## 2015-07-15 MED ORDER — HYDROMORPHONE HCL 1 MG/ML IJ SOLN: 0.2500 mg | INTRAMUSCULAR | Status: DC | PRN

## 2015-07-15 MED ORDER — NALBUPHINE HCL 10 MG/ML IJ SOLN: 5.0000 mg | INTRAMUSCULAR | Status: DC | PRN

## 2015-07-15 MED ORDER — ACETAMINOPHEN 325 MG PO TABS: 650.0000 mg | ORAL_TABLET | ORAL | Status: DC | PRN

## 2015-07-15 MED ORDER — ACETAMINOPHEN 160 MG/5ML PO SOLN
975.0000 mg | Freq: Once | ORAL | Status: AC
  Administered 2015-07-15: 975 mg via ORAL

## 2015-07-15 MED ORDER — LACTATED RINGERS IV SOLN
INTRAVENOUS | Status: DC
  Administered 2015-07-15 – 2015-07-16 (×2): via INTRAVENOUS

## 2015-07-15 MED ORDER — SIMETHICONE 80 MG PO CHEW
80.0000 mg | CHEWABLE_TABLET | Freq: Three times a day (TID) | ORAL | Status: DC
  Administered 2015-07-16 – 2015-07-17 (×6): 80 mg via ORAL
  Filled 2015-07-15 (×6): qty 1

## 2015-07-15 MED ORDER — PRENATAL MULTIVITAMIN CH
1.0000 | ORAL_TABLET | Freq: Every day | ORAL | Status: DC
  Administered 2015-07-16 – 2015-07-17 (×2): 1 via ORAL
  Filled 2015-07-15 (×2): qty 1

## 2015-07-15 MED ORDER — ONDANSETRON HCL 4 MG/2ML IJ SOLN: 4.0000 mg | Freq: Three times a day (TID) | INTRAMUSCULAR | Status: DC | PRN

## 2015-07-15 MED ORDER — SIMETHICONE 80 MG PO CHEW
80.0000 mg | CHEWABLE_TABLET | ORAL | Status: DC
  Administered 2015-07-16 – 2015-07-17 (×3): 80 mg via ORAL
  Filled 2015-07-15 (×3): qty 1

## 2015-07-15 MED ORDER — FENTANYL CITRATE (PF) 100 MCG/2ML IJ SOLN
INTRAMUSCULAR | Status: AC
  Filled 2015-07-15: qty 4

## 2015-07-15 MED ORDER — SCOPOLAMINE 1 MG/3DAYS TD PT72
1.0000 | MEDICATED_PATCH | Freq: Once | TRANSDERMAL | Status: DC
  Administered 2015-07-15: 1.5 mg via TRANSDERMAL

## 2015-07-15 MED ORDER — OXYTOCIN 40 UNITS IN LACTATED RINGERS INFUSION - SIMPLE MED: 62.5000 mL/h | INTRAVENOUS | Status: AC

## 2015-07-15 MED ORDER — ONDANSETRON HCL 4 MG/2ML IJ SOLN
INTRAMUSCULAR | Status: AC
  Filled 2015-07-15: qty 2

## 2015-07-15 MED ORDER — OXYTOCIN 10 UNIT/ML IJ SOLN
40.0000 [IU] | INTRAVENOUS | Status: DC | PRN
  Administered 2015-07-15: 40 [IU] via INTRAVENOUS

## 2015-07-15 MED ORDER — FENTANYL CITRATE (PF) 100 MCG/2ML IJ SOLN
INTRAMUSCULAR | Status: DC | PRN
  Administered 2015-07-15: 25 ug via INTRATHECAL
  Administered 2015-07-15: 25 ug via INTRAVENOUS

## 2015-07-15 MED ORDER — BUPIVACAINE HCL (PF) 0.5 % IJ SOLN
INTRAMUSCULAR | Status: DC | PRN
  Administered 2015-07-15: 30 mL

## 2015-07-15 MED ORDER — DIBUCAINE 1 % RE OINT: 1.0000 "application " | TOPICAL_OINTMENT | RECTAL | Status: DC | PRN

## 2015-07-15 MED ORDER — LANOLIN HYDROUS EX OINT: 1.0000 "application " | TOPICAL_OINTMENT | CUTANEOUS | Status: DC | PRN

## 2015-07-15 MED ORDER — ONDANSETRON HCL 4 MG/2ML IJ SOLN
INTRAMUSCULAR | Status: DC | PRN
  Administered 2015-07-15: 4 mg via INTRAVENOUS

## 2015-07-15 MED ORDER — OXYCODONE-ACETAMINOPHEN 5-325 MG PO TABS
1.0000 | ORAL_TABLET | ORAL | Status: DC | PRN
  Administered 2015-07-16 – 2015-07-18 (×3): 1 via ORAL
  Filled 2015-07-15 (×3): qty 1

## 2015-07-15 MED ORDER — DEXTROSE 5 % IV SOLN: 1.0000 ug/kg/h | INTRAVENOUS | Status: DC | PRN

## 2015-07-15 MED ORDER — BUPIVACAINE HCL (PF) 0.5 % IJ SOLN
INTRAMUSCULAR | Status: AC
  Filled 2015-07-15: qty 30

## 2015-07-15 MED ORDER — NALBUPHINE HCL 10 MG/ML IJ SOLN: 5.0000 mg | Freq: Once | INTRAMUSCULAR | Status: AC | PRN

## 2015-07-15 MED ORDER — ACETAMINOPHEN 500 MG PO TABS
1000.0000 mg | ORAL_TABLET | Freq: Four times a day (QID) | ORAL | Status: AC
  Administered 2015-07-15 – 2015-07-16 (×3): 1000 mg via ORAL
  Filled 2015-07-15 (×3): qty 2

## 2015-07-15 MED ORDER — PHENYLEPHRINE 8 MG IN D5W 100 ML (0.08MG/ML) PREMIX OPTIME
INJECTION | INTRAVENOUS | Status: AC
  Filled 2015-07-15: qty 100

## 2015-07-15 MED ORDER — WITCH HAZEL-GLYCERIN EX PADS
1.0000 | MEDICATED_PAD | CUTANEOUS | Status: DC | PRN
Start: 2015-07-15 — End: 2015-07-18

## 2015-07-15 MED ORDER — SENNOSIDES-DOCUSATE SODIUM 8.6-50 MG PO TABS
2.0000 | ORAL_TABLET | ORAL | Status: DC
  Administered 2015-07-16 – 2015-07-17 (×3): 2 via ORAL
  Filled 2015-07-15 (×3): qty 2

## 2015-07-15 MED ORDER — OXYCODONE-ACETAMINOPHEN 5-325 MG PO TABS: 2.0000 | ORAL_TABLET | ORAL | Status: DC | PRN

## 2015-07-15 MED ORDER — OXYTOCIN 10 UNIT/ML IJ SOLN
INTRAMUSCULAR | Status: AC
  Filled 2015-07-15: qty 4

## 2015-07-15 MED ORDER — MEPERIDINE HCL 25 MG/ML IJ SOLN: 6.2500 mg | INTRAMUSCULAR | Status: DC | PRN

## 2015-07-15 MED ORDER — IBUPROFEN 600 MG PO TABS
600.0000 mg | ORAL_TABLET | Freq: Four times a day (QID) | ORAL | Status: DC
  Administered 2015-07-16 – 2015-07-18 (×10): 600 mg via ORAL
  Filled 2015-07-15 (×10): qty 1

## 2015-07-15 MED ORDER — ACETAMINOPHEN 160 MG/5ML PO SOLN
ORAL | Status: AC
  Administered 2015-07-15: 975 mg via ORAL
  Filled 2015-07-15: qty 40.6

## 2015-07-15 MED ORDER — DIPHENHYDRAMINE HCL 25 MG PO CAPS: 25.0000 mg | ORAL_CAPSULE | ORAL | Status: DC | PRN

## 2015-07-15 MED ORDER — MENTHOL 3 MG MT LOZG
1.0000 | LOZENGE | OROMUCOSAL | Status: DC | PRN
  Administered 2015-07-16 – 2015-07-17 (×2): 3 mg via ORAL
  Filled 2015-07-15 (×2): qty 9

## 2015-07-15 MED ORDER — DIPHENHYDRAMINE HCL 25 MG PO CAPS: 25.0000 mg | ORAL_CAPSULE | Freq: Four times a day (QID) | ORAL | Status: DC | PRN

## 2015-07-15 MED ORDER — ZOLPIDEM TARTRATE 5 MG PO TABS: 5.0000 mg | ORAL_TABLET | Freq: Every evening | ORAL | Status: DC | PRN

## 2015-07-15 MED ORDER — DIPHENHYDRAMINE HCL 50 MG/ML IJ SOLN: 12.5000 mg | INTRAMUSCULAR | Status: DC | PRN

## 2015-07-15 MED ORDER — NALOXONE HCL 0.4 MG/ML IJ SOLN: 0.4000 mg | INTRAMUSCULAR | Status: DC | PRN

## 2015-07-15 MED ORDER — SCOPOLAMINE 1 MG/3DAYS TD PT72: 1.0000 | MEDICATED_PATCH | Freq: Once | TRANSDERMAL | Status: DC

## 2015-07-15 MED ORDER — TETANUS-DIPHTH-ACELL PERTUSSIS 5-2.5-18.5 LF-MCG/0.5 IM SUSP: 0.5000 mL | Freq: Once | INTRAMUSCULAR | Status: DC

## 2015-07-15 MED ORDER — MORPHINE SULFATE (PF) 0.5 MG/ML IJ SOLN
INTRAMUSCULAR | Status: DC | PRN
  Administered 2015-07-15: .1 mg via INTRATHECAL

## 2015-07-15 MED ORDER — SIMETHICONE 80 MG PO CHEW: 80.0000 mg | CHEWABLE_TABLET | ORAL | Status: DC | PRN

## 2015-07-15 MED ORDER — KETOROLAC TROMETHAMINE 30 MG/ML IJ SOLN
INTRAMUSCULAR | Status: AC
  Filled 2015-07-15: qty 1

## 2015-07-15 MED ORDER — LACTATED RINGERS IV SOLN
INTRAVENOUS | Status: DC
  Administered 2015-07-15 (×3): via INTRAVENOUS

## 2015-07-15 MED ORDER — SCOPOLAMINE 1 MG/3DAYS TD PT72
MEDICATED_PATCH | TRANSDERMAL | Status: AC
  Administered 2015-07-15: 1.5 mg via TRANSDERMAL
  Filled 2015-07-15: qty 1

## 2015-07-15 MED ORDER — LACTATED RINGERS IV SOLN
INTRAVENOUS | Status: DC
  Administered 2015-07-15: 14:00:00 via INTRAVENOUS

## 2015-07-15 MED ORDER — KETOROLAC TROMETHAMINE 30 MG/ML IJ SOLN
30.0000 mg | Freq: Once | INTRAMUSCULAR | Status: AC
Start: 2015-07-15 — End: 2015-07-15
  Administered 2015-07-15: 30 mg via INTRAMUSCULAR

## 2015-07-15 MED ORDER — MORPHINE SULFATE 0.5 MG/ML IJ SOLN
INTRAMUSCULAR | Status: AC
  Filled 2015-07-15: qty 100

## 2015-07-15 MED ORDER — PHENYLEPHRINE 8 MG IN D5W 100 ML (0.08MG/ML) PREMIX OPTIME
INJECTION | INTRAVENOUS | Status: DC | PRN
  Administered 2015-07-15: 60 ug/min via INTRAVENOUS

## 2015-07-15 MED ORDER — SODIUM CHLORIDE 0.9 % IJ SOLN: 3.0000 mL | INTRAMUSCULAR | Status: DC | PRN

## 2015-07-15 SURGICAL SUPPLY — 36 items
BENZOIN TINCTURE PRP APPL 2/3 (GAUZE/BANDAGES/DRESSINGS) ×3 IMPLANT
CLAMP CORD UMBIL (MISCELLANEOUS) IMPLANT
CLOSURE WOUND 1/2 X4 (GAUZE/BANDAGES/DRESSINGS) ×1
CLOTH BEACON ORANGE TIMEOUT ST (SAFETY) ×3 IMPLANT
DRAPE SHEET LG 3/4 BI-LAMINATE (DRAPES) ×3 IMPLANT
DRSG OPSITE POSTOP 4X10 (GAUZE/BANDAGES/DRESSINGS) ×3 IMPLANT
DURAPREP 26ML APPLICATOR (WOUND CARE) ×3 IMPLANT
ELECT CAUTERY BLADE 6.4 (BLADE) ×3 IMPLANT
ELECT REM PT RETURN 9FT ADLT (ELECTROSURGICAL) ×3
ELECTRODE REM PT RTRN 9FT ADLT (ELECTROSURGICAL) ×1 IMPLANT
EXTRACTOR VACUUM KIWI (MISCELLANEOUS) IMPLANT
GLOVE BIO SURGEON STRL SZ7 (GLOVE) ×3 IMPLANT
GLOVE BIOGEL PI IND STRL 7.0 (GLOVE) ×1 IMPLANT
GLOVE BIOGEL PI INDICATOR 7.0 (GLOVE) ×2
GOWN STRL REUS W/TWL LRG LVL3 (GOWN DISPOSABLE) ×6 IMPLANT
GOWN STRL REUS W/TWL XL LVL3 (GOWN DISPOSABLE) ×3 IMPLANT
KIT ABG SYR 3ML LUER SLIP (SYRINGE) IMPLANT
NEEDLE HYPO 22GX1.5 SAFETY (NEEDLE) ×3 IMPLANT
NEEDLE HYPO 25X5/8 SAFETYGLIDE (NEEDLE) IMPLANT
NS IRRIG 1000ML POUR BTL (IV SOLUTION) ×3 IMPLANT
PACK C SECTION WH (CUSTOM PROCEDURE TRAY) ×3 IMPLANT
PAD OB MATERNITY 4.3X12.25 (PERSONAL CARE ITEMS) ×3 IMPLANT
RTRCTR C-SECT PINK 25CM LRG (MISCELLANEOUS) IMPLANT
SPONGE LAP 18X18 X RAY DECT (DISPOSABLE) ×3 IMPLANT
SPONGE SURGIFOAM ABS GEL 12-7 (HEMOSTASIS) IMPLANT
STRIP CLOSURE SKIN 1/2X4 (GAUZE/BANDAGES/DRESSINGS) ×2 IMPLANT
SUT PDS AB 0 CTX 60 (SUTURE) ×3 IMPLANT
SUT PLAIN 0 NONE (SUTURE) IMPLANT
SUT SILK 0 TIES 10X30 (SUTURE) IMPLANT
SUT VIC AB 0 CT1 36 (SUTURE) ×18 IMPLANT
SUT VIC AB 3-0 CT1 27 (SUTURE) ×2
SUT VIC AB 3-0 CT1 TAPERPNT 27 (SUTURE) ×1 IMPLANT
SUT VIC AB 4-0 KS 27 (SUTURE) ×3 IMPLANT
SYR CONTROL 10ML LL (SYRINGE) ×3 IMPLANT
TOWEL OR 17X24 6PK STRL BLUE (TOWEL DISPOSABLE) ×3 IMPLANT
TRAY FOLEY CATH SILVER 14FR (SET/KITS/TRAYS/PACK) ×3 IMPLANT

## 2015-07-15 NOTE — Brief Op Note (Signed)
07/15/2015  3:32 PM  PATIENT:  Brenda Solomon  34 y.o. female  PRE-OPERATIVE DIAGNOSIS:   History of uterine rupture resulting in fetal death, REPEAT C/section x 2  POST-OPERATIVE DIAGNOSIS:  History of uterine rupture resulting in fetal death, REPEAT C/section x 2  PROCEDURE:  Procedure(s): CESAREAN SECTION (N/A)  SURGEON:  Surgeon(s) and Role:    * Willodean Rosenthal, MD - Primary    * Federico Flake, MD - Assisting  PHYSICIAN ASSISTANT:   ASSISTANTS: none   ANESTHESIA:   spinal  EBL:  Total I/O In: 2000 [I.V.:2000] Out: 950 [Urine:250; Blood:700]  BLOOD ADMINISTERED:none  DRAINS: none   LOCAL MEDICATIONS USED:  MARCAINE     SPECIMEN:  Source of Specimen:  Placenta  DISPOSITION OF SPECIMEN:  To L&D  COUNTS:  YES  TOURNIQUET:  * No tourniquets in log *  DICTATION: .Note written in EPIC  PLAN OF CARE: Admit to inpatient   PATIENT DISPOSITION:  PACU - hemodynamically stable.   Delay start of Pharmacological VTE agent (>24hrs) due to surgical blood loss or risk of bleeding: not applicable

## 2015-07-15 NOTE — H&P (Signed)
Obstetric Preoperative History and Physical  Brenda Solomon is a 34 y.o. G3P2001 with IUP at 75w0dpresenting for presenting for scheduled cesarean section.  No acute concerns.   Last drank at 10 AM- water Last food at [place] Labs  Dating LMP c/w 8 week scan Blood type: A/POS/-- (02/04 1610)   Genetic Screen  Quad screen negative Antibody:NEG (02/04 1610)  Anatomic [place] 19 wks; left echogenic focus, otherwise nml Rubella: 4.10 (02/04 1610)  GTT Early:   198             Third trimester:  RPR: NON REAC (02/04 1610)   Flu vaccine 01/14/15 HBsAg: NEGATIVE (02/04 1610)   TDaP vaccine  05/27/15                                             Rhogam: HIV: NONREACTIVE (02/04 1610)   GBS     Pos in urine      GBS:  Pos in urine  Contraception Condoms Pap: Negative; GC/CT neg x 2  Baby Food Breast   Circumcision  Female   Pediatrician GFordochePerson Husband    Prenatal Course [facility] with onset of care at 10 weeks Pregnancy complications or risks: Patient Active Problem List   Diagnosis Date Noted  . Hx of intrauterine fetal death, currently pregnant   . Previous stillbirth or demise, antepartum 06/24/2015  . Diet controlled gestational diabetes mellitus in third trimester   . Supervision of high risk pregnancy in second trimester   . Advanced maternal age in multigravida   . Gestational diabetes mellitus in pregnancy 02/11/2015  . Group B streptococcus urinary tract infection affecting pregnancy, antepartum 01/16/2015  . Previous cesarean section  x 2 complicating pregnancy, antepartum condition or complication 065/02/5464 . Uterine rupture in previous pregnancy resulting in IUFD at 333weeks 01/14/2015  . Cellulitis and abscess 02/25/2014  . Abscess of left breast s/p I&D 2/5 & 02/04/2014 02/02/2014   She plans to breastfeed She desires condoms for postpartum contraception.   Prenatal labs and studies: ABO, Rh: --/--/A POS (08/03  0840) Antibody: NEG (08/03 0840) Rubella: 4.10 (02/04 1610) RPR: Non Reactive (08/03 0840)  HBsAg: NEGATIVE (02/04 1610)  HIV: NONREACTIVE (06/02 1153)  GBS: POS in Urine 1 hr Glucola  198 (early) Genetic screening normal Anatomy [place]  Prenatal Transfer Tool  Maternal Diabetes: Yes:  Diabetes Type:  Diet controlled Genetic Screening: Normal Maternal Ultrasounds/Referrals: Normal Fetal Ultrasounds or other Referrals:  None Maternal Substance Abuse:  No Significant Maternal Medications:  None Significant Maternal Lab Results: None  Past Medical History  Diagnosis Date  . Gestational diabetes 2013  . Abscess of breast 01/12/2014    Left breast. I&D 01/12/2014     Past Surgical History  Procedure Laterality Date  . Cesarean section  2012  . Cesarean section  02/16/2012    Procedure: CESAREAN SECTION;  Surgeon: UOsborne Oman MD;  [facility];  Service: Gynecology;  Laterality: N/A;  Repair of Ruptured Uterus  . Breast surgery    . Irrigation and debridement abscess Left 02/03/2014    Procedure: IRRIGATION AND DEBRIDEMENT ABSCESS;  Surgeon: ALeighton Ruff MD;  Location: [facility];  Service: General;  Laterality: Left;  . Incision and drainage abscess Left 02/07/2014    Procedure: INCISION  AND DRAINAGE ABSCESS LEFT BREAST;  Surgeon: Edward Jolly, MD;  Location: [facility];  Service: General;  Laterality: Left;    OB History  Gravida Para Term Preterm AB SAB TAB Ectopic Multiple Living  3 2 2       1     # Outcome Date GA Lbr Len/2nd Weight Sex Delivery Anes PTL Lv  3 Current           2 Term 02/16/12 [redacted]w[redacted]d 5 lb 10.8 oz (2.575 kg) F CS-LTranv EPI  FD     Comments: maternal uterine rupture, IUFD  1 Term 12/18/10 378w0d5 lb 2 oz (2.325 kg) F CS-LTranv        Comments: C-section due to being breech    Obstetric Comments  Gestational diabetes during first pregnancy, diet-controlled    History   Social History  . Marital Status: Married    Spouse Name: N/A  .  Number of Children: 1  . Years of Education: college   Occupational History  .      college student   Social History Main Topics  . Smoking status: Never Smoker   . Smokeless tobacco: Never Used  . Alcohol Use: No  . Drug Use: No  . Sexual Activity: Yes    Birth Control/ Protection: None   Other Topics Concern  . Not on file   Social History Narrative   Patient lives at home with husband  (Brenda Solomon). 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   Right handed.   Caffeine tea one daily hot tea.    Family History  Problem Relation Age of Onset  . Diabetes type II Mother   . Diabetes Mother   . Diabetes type II Father   . Diabetes Father     Prescriptions prior to admission  Medication Sig Dispense Refill Last Dose  . Prenatal Vit-Fe Fumarate-FA (PRENATAL MULTIVITAMIN) TABS tablet Take 1 tablet by mouth daily at 12 noon.     . Blood Glucose Monitoring Suppl (CONTOUR NEXT EZ MONITOR) W/DEVICE KIT 1 each by Does not apply route 4 (four) times daily. Bayer Contour Next glucometer for new DX GDM O24.419 for testing 4 times daily 1 kit o Taking  . glucose blood test strip Use as instructed 100 each 12 Taking  . Omega-3 Fatty Acids (FISH OIL) 1000 MG CAPS Take 1 capsule by mouth daily.    Taking  . OVER THE COUNTER MEDICATION 2 drops 2 (two) times daily. Over the counter eye drops   Taking   No Known Allergies  Review of Systems: Negative except for what is mentioned in HPI.  Physical Exam: BP 103/53 mmHg  Pulse 108  Temp(Src) 98.2 F (36.8 C) (Oral)  Resp 20  SpO2 100%  LMP 11/05/2014 (Approximate) CONSTITUTIONAL: Well-developed, well-nourished female in no acute distress.  HENT:  Normocephalic, atraumatic, External right and left ear normal. Oropharynx is clear and moist EYES: Conjunctivae and EOM are normal. Pupils are equal, round, and reactive to light. No scleral icterus.  NECK: Normal range of motion, supple, no masses SKIN: Skin is warm and dry. No rash  noted. Not diaphoretic. No erythema. No pallor. NEMaxeysAlert and oriented to person, place, and time. Normal reflexes, muscle tone coordination. No cranial nerve deficit noted. PSYCHIATRIC: Normal mood and affect. Normal behavior. Normal judgment and thought content. CARDIOVASCULAR: Normal heart rate noted, regular rhythm RESPIRATORY: Effort and breath sounds normal, no problems with respiration noted ABDOMEN: Soft, nontender, nondistended, gravid.  Well-healed Pfannenstiel incision. PELVIC: Deferred MUSCULOSKELETAL: Normal range of motion. No edema and no tenderness. 2+ distal pulses.   Pertinent Labs/Studies:   Results for orders placed or performed during the hospital encounter of 07/14/15 (from the past 72 hour(s))  CBC     Status: None   Collection Time: 07/14/15  8:40 AM  Result Value Ref Range   WBC 8.4 4.0 - 10.5 K/uL   RBC 4.08 3.87 - 5.11 MIL/uL   Hemoglobin 12.0 12.0 - 15.0 g/dL   HCT 36.2 36.0 - 46.0 %   MCV 88.7 78.0 - 100.0 fL   MCH 29.4 26.0 - 34.0 pg   MCHC 33.1 30.0 - 36.0 g/dL   RDW 15.2 11.5 - 15.5 %   Platelets 150 150 - 400 K/uL  RPR     Status: None   Collection Time: 07/14/15  8:40 AM  Result Value Ref Range   RPR Ser Ql Non Reactive Non Reactive    Comment: (NOTE) Performed At: [facility] [place], [place] [id number] Lindon Romp MD JZ:[id number]   Type and screen     Status: None   Collection Time: 07/14/15  8:40 AM  Result Value Ref Range   ABO/RH(D) A POS    Antibody Screen NEG    Sample Expiration 48/12/6551   Basic metabolic panel     Status: Abnormal   Collection Time: 07/14/15  8:40 AM  Result Value Ref Range   Sodium 131 (L) 135 - 145 mmol/L   Potassium 4.2 3.5 - 5.1 mmol/L   Chloride 102 101 - 111 mmol/L   CO2 21 (L) 22 - 32 mmol/L   Glucose, Bld 88 65 - 99 mg/dL   BUN 7 6 - 20 mg/dL   Creatinine, Ser 0.53 0.44 - 1.00 mg/dL   Calcium 8.6 (L) 8.9 - 10.3 mg/dL   GFR calc non Af Amer >60 >60 mL/min   GFR  calc Af Amer >60 >60 mL/min    Comment: (NOTE) The eGFR has been calculated using the CKD EPI equation. This calculation has not been validated in all clinical situations. eGFR's persistently <60 mL/min signify possible Chronic Kidney Disease.    Anion gap 8 5 - 15    Assessment and Plan :Brenda Solomon is a 34 y.o. G3P2001 at 42w0dbeing admitted being admitted for scheduled cesarean section. The risks of cesarean section discussed with the patient included but were not limited to: bleeding which may require transfusion or reoperation; infection which may require antibiotics; injury to bowel, bladder, ureters or other surrounding organs; injury to the fetus; need for additional procedures including hysterectomy in the event of a life-threatening hemorrhage; placental abnormalities wth subsequent pregnancies, incisional problems, thromboembolic phenomenon and other postoperative/anesthesia complications. The patient concurred with the proposed plan, giving informed written consent for the procedure. Patient has been NPO since last night she will remain NPO for procedure. Anesthesia and OR aware. Preoperative prophylactic antibiotics and SCDs ordered on call to the OR. To OR when ready.   KCaren Macadam MD Family Practice OB Fellow  Faculty Practice, [facility]

## 2015-07-15 NOTE — Anesthesia Preprocedure Evaluation (Addendum)
Anesthesia Evaluation  Patient identified by MRN, date of birth, ID band Patient awake    Reviewed: Allergy & Precautions, H&P , Patient's Chart, lab work & pertinent test results  Airway Mallampati: II TM Distance: >3 FB Neck ROM: full    Dental no notable dental hx.    Pulmonary  breath sounds clear to auscultation  Pulmonary exam normal       Cardiovascular Exercise Tolerance: Good Rhythm:regular Rate:Normal     Neuro/Psych    GI/Hepatic   Endo/Other  diabetes, Gestational  Renal/GU      Musculoskeletal   Abdominal   Peds  Hematology   Anesthesia Other Findings   Reproductive/Obstetrics                           Anesthesia Physical Anesthesia Plan  ASA: II  Anesthesia Plan: Spinal   Post-op Pain Management:    Induction:   Airway Management Planned:   Additional Equipment:   Intra-op Plan:   Post-operative Plan:   Informed Consent: I have reviewed the patients History and Physical, chart, labs and discussed the procedure including the risks, benefits and alternatives for the proposed anesthesia with the patient or authorized representative who has indicated his/her understanding and acceptance.   Dental Advisory Given  Plan Discussed with: CRNA  Anesthesia Plan Comments: (Lab work confirmed with CRNA in room. Platelets okay. Discussed spinal anesthetic, and patient consents to the procedure:  included risk of possible headache,backache, failed block, allergic reaction, and nerve injury. This patient was asked if she had any questions or concerns before the procedure started. )        Anesthesia Quick Evaluation  

## 2015-07-15 NOTE — Transfer of Care (Signed)
Immediate Anesthesia Transfer of Care Note  Patient: Brenda Solomon  Procedure(s) Performed: Procedure(s): CESAREAN SECTION (N/A)  Patient Location: PACU  Anesthesia Type:Spinal  Level of Consciousness: awake  Airway & Oxygen Therapy: Patient Spontanous Breathing  Post-op Assessment: Report given to RN  Post vital signs: Reviewed and stable  Last Vitals:  Filed Vitals:   07/15/15 1323  BP: 103/53  Pulse: 108  Temp: 36.8 C  Resp: 20    Complications: No apparent anesthesia complications

## 2015-07-15 NOTE — Op Note (Signed)
07/15/2015  3:32 PM  PATIENT:  Brenda Solomon  34 y.o. female  PRE-OPERATIVE DIAGNOSIS:   History of uterine rupture resulting in fetal death, REPEAT C/section x 2.  POST-OPERATIVE DIAGNOSIS:  History of uterine rupture resulting in fetal death, REPEAT C/section x 2  PROCEDURE:  Procedure(s): CESAREAN SECTION (N/A)  SURGEON:  Surgeon(s) and Role:    * Willodean Rosenthal, MD - Primary    * Federico Flake, MD - Assisting  PHYSICIAN ASSISTANT:   ASSISTANTS: none   ANESTHESIA:   spinal  EBL:  Total I/O In: 2000 [I.V.:2000] Out: 950 [Urine:250; Blood:741ml   INDICATIONS: Brenda Solomon is a 34 y.o. G3P2101 at [redacted]w[redacted]d here for cesarean section secondary to the indications listed under preoperative diagnoses; please see preoperative note for further details.  The risks of cesarean section were discussed with the patient including but were not limited to: bleeding which may require transfusion or reoperation; infection which may require antibiotics; injury to bowel, bladder, ureters or other surrounding organs; injury to the fetus; need for additional procedures including hysterectomy in the event of a life-threatening hemorrhage; placental abnormalities wth subsequent pregnancies, incisional problems, thromboembolic phenomenon and other postoperative/anesthesia complications.   The patient concurred with the proposed plan, giving informed written consent for the procedure.    FINDINGS:  Viable female infant in cephalic presentation.  Apgars 8 and 9.  Clear amniotic fluid.  Intact placenta, three vessel cord.  Normal uterus, fallopian tubes and ovaries bilaterally.  ANESTHESIA: Spinal INTRAVENOUS FLUIDS: 2000 ml ESTIMATED BLOOD LOSS: 700 ml URINE OUTPUT:  275 ml SPECIMENS: Placenta sent to pathology to L&D COMPLICATIONS: None immediate  PROCEDURE IN DETAIL:  The patient preoperatively received intravenous antibiotics and had sequential compression devices applied to her lower  extremities.  She was then taken to the operating room where spinal anesthesia was administered and was found to be adequate. She was then placed in a dorsal supine position with a leftward tilt, and prepped and draped in a sterile manner.  A foley catheter was placed into her bladder and attached to constant gravity.  After an adequate timeout was performed. The decision was made to enter the superior Pfannenstiel scar. Pfannenstiel skin incision was made with scalpel and carried through to the underlying layer of fascia.  The fascia was incised in the midline, and this incision was extended bilaterally using the Mayo scissors.  Kocher clamps were applied to the superior aspect of the fascial incision and the underlying rectus muscles were dissected off bluntly. A similar process was carried out on the inferior aspect of the fascial incision. The rectus muscles were separated in the midline bluntly and the peritoneum was entered bluntly. Attention was turned to the lower uterine segment where a low transverse hysterotomy was made with a scalpel and extended bilaterally bluntly.  The placenta was encountered within the incision.  The infant was successfully delivered, the cord was clamped and cut after 1 minute and the infant was handed over to awaiting neonatology team. Uterine massage was then administered, and the placenta delivered intact with a three-vessel cord. The uterus was then cleared of clot and debris. The was an adhesion extending from the right lower uterine segment to the fundus. This was clamped, cut and tied to achieve hemostasis The hysterotomy was closed with 0 Vicryl in a running locked fashion, and an imbricating layer was also placed with 0 Vicryl. Figure-of-eight serosal stitches were placed to help with hemostasis.  The pelvis was cleared of all clot and debris.  Hemostasis was confirmed on all surfaces.  The peritoneum and rectus muscles in one interrupted suture using 0 Vicryl. The full  thickness repaired was performed on the fascia using PDS in a running fashion.  The subcutaneous layer was irrigated 30 ml of 0.5% Marcaine was injected subcutaneously around the incision.  The skin was closed with a Mellody Dance needle on 4-0 Vicryl subcuticular stitch. The patient tolerated the procedure well. Sponge, lap, instrument and needle counts were correct x 2.  She was taken to the recovery room in stable condition.   Federico Flake, MD Family Medicine, OB Fellow [facility]

## 2015-07-15 NOTE — Anesthesia Procedure Notes (Signed)
Spinal Patient location during procedure: OR Preanesthetic Checklist Completed: patient identified, site marked, surgical consent, pre-op evaluation, timeout performed, IV checked, risks and benefits discussed and monitors and equipment checked Spinal Block Patient position: sitting Prep: DuraPrep Patient monitoring: heart rate, cardiac monitor, continuous pulse ox and blood pressure Approach: midline Location: L3-4 Injection technique: single-shot Needle Needle type: Sprotte  Needle gauge: 24 G Needle length: 9 cm Assessment Sensory level: T4 Additional Notes Spinal Dosage in OR  Bupivicaine ml       1.2 PFMS04   mcg        100 Fentanyl mcg            25    

## 2015-07-16 ENCOUNTER — Encounter (HOSPITAL_COMMUNITY): Payer: Self-pay | Admitting: Obstetrics & Gynecology

## 2015-07-16 LAB — CBC
HEMATOCRIT: 30.3 % — AB (ref 36.0–46.0)
HEMOGLOBIN: 9.8 g/dL — AB (ref 12.0–15.0)
MCH: 29.3 pg (ref 26.0–34.0)
MCHC: 32.3 g/dL (ref 30.0–36.0)
MCV: 90.4 fL (ref 78.0–100.0)
PLATELETS: 122 10*3/uL — AB (ref 150–400)
RBC: 3.35 MIL/uL — AB (ref 3.87–5.11)
RDW: 15.4 % (ref 11.5–15.5)
WBC: 8.8 10*3/uL (ref 4.0–10.5)

## 2015-07-16 LAB — GLUCOSE, CAPILLARY
GLUCOSE-CAPILLARY: 131 mg/dL — AB (ref 65–99)
GLUCOSE-CAPILLARY: 128 mg/dL — AB (ref 65–99)
Glucose-Capillary: 150 mg/dL — ABNORMAL HIGH (ref 65–99)

## 2015-07-16 LAB — BIRTH TISSUE RECOVERY COLLECTION (PLACENTA DONATION)

## 2015-07-16 NOTE — Addendum Note (Signed)
Addendum  created 07/16/15 0813 by Renford Dills, CRNA   Modules edited: Notes Section   Notes Section:  File: [id number]

## 2015-07-16 NOTE — Lactation Note (Addendum)
This note was copied from the chart of Brenda Laqueta Bonaventura. Lactation Consultation Note  Initial visit made.  Breastfeeding consultation services and support information given to patient.  Mom has care of the Late Preterm Baby at bedside.  Reviewed late preterm feeding norm and feeding/pumping plan.  Mom is very sleepy and having much difficulty keeping her eyes open.  Baby was sleeping but once unwrapped from blankets she started cueing.  I positioned baby in football hold and observed a good 20 minute feeding.  Baby continued to show feeding cues after breast so supplemented with 15 mls of neosure by bottle.  Patient has a history of a breast abcess/I&D left lateral breast in 2015.  Report given to [facility] RN.  Patient Name: Brenda Solomon [id number] Date: 07/16/2015 Reason for consult: Initial assessment;Infant < 6lbs;Late preterm infant   Maternal Data    Feeding Feeding Type: Breast Fed Length of feed: 20 min  LATCH Score/Interventions Latch: Grasps breast easily, tongue down, lips flanged, rhythmical sucking. Intervention(s): Adjust position;Assist with latch;Breast massage;Breast compression  Audible Swallowing: Spontaneous and intermittent  Type of Nipple: Everted at rest and after stimulation  Comfort (Breast/Nipple): Soft / non-tender     Hold (Positioning): Full assist, staff holds infant at breast  LATCH Score: 8  Lactation Tools Discussed/Used     Consult Status Consult Status: Follow-up Date: 07/17/15 Follow-up type: In-patient    Huston Foley 07/16/2015, 2:22 PM

## 2015-07-16 NOTE — Anesthesia Postprocedure Evaluation (Signed)
  Anesthesia Post-op Note  Patient: Brenda Solomon  Procedure(s) Performed: Procedure(s): CESAREAN SECTION (N/A)  Patient Location: Mother/Baby  Anesthesia Type:Spinal  Level of Consciousness: awake  Airway and Oxygen Therapy: Patient Spontanous Breathing  Post-op Pain: mild  Post-op Assessment: Patient's Cardiovascular Status Stable and Respiratory Function Stable LLE Motor Response: Purposeful movement LLE Sensation: Tingling RLE Motor Response: Purposeful movement RLE Sensation: Tingling      Post-op Vital Signs: stable  Last Vitals:  Filed Vitals:   07/16/15 0500  BP: 81/48  Pulse: 82  Temp: 36.8 C  Resp: 18    Complications: No apparent anesthesia complications

## 2015-07-16 NOTE — Progress Notes (Signed)
Post Partum Day 1 Subjective:  Brenda Solomon is a 34 y.o. [id number] [redacted]w[redacted]d s/p rLTCS for hx of uterine rupture with subsequent IUFD.  No acute events overnight.  Pt denies problems with ambulating, voiding or po intake.  She denies nausea or vomiting.  Pain is well controlled.  She has had flatus. She has not had bowel movement.  Lochia Minimal.  Plan for birth control is condoms.  Method of Feeding: breast  Objective: Blood pressure 81/48, pulse 82, temperature 98.3 F (36.8 C), temperature source Oral, resp. rate 18, last menstrual period 11/05/2014, SpO2 98 %, unknown if currently breastfeeding.  Physical Exam:  General: alert, cooperative and no distress Lochia:normal flow Chest: CTAB Heart: RRR no m/r/g Abdomen: +BS, soft, nontender,  Uterine Fundus: firm, below umbilicus Incision: clean and intact, minimal bleeding at site  DVT Evaluation: No evidence of DVT seen on physical exam. Extremities: no edema   Recent Labs  07/14/15 0840 07/16/15 0613  HGB 12.0 9.8*  HCT 36.2 30.3*    Assessment/Plan:  ASSESSMENT: Brenda Solomon is a 34 y.o. A5W0981 [redacted]w[redacted]d s/p rLTCS  Plan for discharge tomorrow and Breastfeeding  Routine pain management and post operative care    LOS: 1 day   De Hollingshead 07/16/2015, 8:07 AM   OB fellow attestation Post Partum Day 1/POD#1 I have seen and examined this patient and agree with above documentation in the resident's note.   Brenda Solomon is a 34 y.o. [id number] s/p rLTCS.  Pt denies problems with ambulating, voiding or po intake. Pain is well controlled.  Plan for birth control is condoms.  Method of Feeding: breast  PE:  BP 81/48 mmHg  Pulse 82  Temp(Src) 98.3 F (36.8 C) (Oral)  Resp 18  SpO2 98%  LMP 11/05/2014 (Approximate)  Breastfeeding? Unknown Fundus firm  Plan for discharge: POD#2 vs #3 pending patient preference Routine post op care  Federico Flake, MD 8:49 AM

## 2015-07-16 NOTE — Anesthesia Postprocedure Evaluation (Signed)
  Anesthesia Post-op Note  Patient: Brenda Solomon  Procedure(s) Performed: Procedure(s): CESAREAN SECTION (N/A)   Patient is awake, responsive, moving her legs, and has signs of resolution of her numbness. Pain and nausea are reasonably well controlled. Vital signs are stable and clinically acceptable. Oxygen saturation is clinically acceptable. There are no apparent anesthetic complications at this time. Patient is ready for discharge.

## 2015-07-17 LAB — GLUCOSE, CAPILLARY
GLUCOSE-CAPILLARY: 95 mg/dL (ref 65–99)
Glucose-Capillary: 120 mg/dL — ABNORMAL HIGH (ref 65–99)

## 2015-07-17 LAB — HEMOGLOBIN AND HEMATOCRIT, BLOOD
HCT: 32.1 % — ABNORMAL LOW (ref 36.0–46.0)
Hemoglobin: 10.3 g/dL — ABNORMAL LOW (ref 12.0–15.0)

## 2015-07-17 MED ORDER — GUAIFENESIN ER 600 MG PO TB12
1200.0000 mg | ORAL_TABLET | Freq: Two times a day (BID) | ORAL | Status: DC
  Administered 2015-07-17 (×2): 1200 mg via ORAL
  Filled 2015-07-17 (×3): qty 2

## 2015-07-17 NOTE — Progress Notes (Signed)
Post Partum Day 2 Subjective:  Brenda Solomon is a 34 y.o. [id number] [redacted]w[redacted]d s/p RLTCS.  No acute events overnight.  Pt denies problems with ambulating, voiding or po intake.  She denies nausea or vomiting.  Pain is well controlled.  She has had flatus. She has not had bowel movement.  Lochia Small.  Plan for birth control is condoms.  Method of Feeding: breast and bottle  Objective: Blood pressure 94/53, pulse 82, temperature 98.1 F (36.7 C), temperature source Oral, resp. rate 18, last menstrual period 11/05/2014, SpO2 99 %, unknown if currently breastfeeding.  Physical Exam:  General: alert, cooperative and no distress Lochia:normal flow Chest: CTAB Heart: RRR no m/r/g Abdomen: +BS, soft, nontender, incision well approximated and non-draining Uterine Fundus: firm DVT Evaluation: No evidence of DVT seen on physical exam. Extremities: no edema   Recent Labs  07/14/15 0840 07/16/15 0613  HGB 12.0 9.8*  HCT 36.2 30.3*    Assessment/Plan:  ASSESSMENT: Brenda Solomon is a 34 y.o. A5W0981 [redacted]w[redacted]d s/p rLTCS who is doing well this morning. Pt states that she has no sig complaints, but does not feel up to going home quite yet.  Plan for discharge tomorrow and Breastfeeding   LOS: 2 days   Lowanda Foster 07/17/2015, 6:58 AM   I was present for the exam and agree with above. C/O cough and scratchy throat. Lungs clear. No fever. Will order Mucinex, encourage incentive spirometer.   [place], CNM 07/17/2015 9:02 AM

## 2015-07-18 MED ORDER — OXYCODONE-ACETAMINOPHEN 5-325 MG PO TABS: 1.0000 | ORAL_TABLET | Freq: Four times a day (QID) | ORAL | Status: AC | PRN

## 2015-07-18 NOTE — Discharge Summary (Signed)
Obstetric Discharge Summary Reason for Admission: cesarean section Prenatal Procedures: none Intrapartum Procedures: cesarean: low cervical, transverse Postpartum Procedures: none Complications-Operative and Postpartum: none  Hospital Course:  Active Problems:   Previous cesarean section  x 2 complicating pregnancy, antepartum condition or complication   Uterine rupture in previous pregnancy resulting in IUFD at 37 weeks   Group B streptococcus urinary tract infection affecting pregnancy, antepartum   Gestational diabetes mellitus in pregnancy   Diet controlled gestational diabetes mellitus in third trimester   Previous stillbirth or demise, antepartum   Status post C-section   Brenda Solomon is a 34 y.o. [id number] s/p rLTCS.  Patient was admitted 8/4.  She has postpartum course that was uncomplicated including no problems with ambulating, PO intake, urination, pain, or bleeding. The pt feels ready to go home and  will be discharged with outpatient follow-up.   Today: No acute events overnight.  Pt denies problems with ambulating, voiding or po intake.  She denies nausea or vomiting.  Pain is well controlled.  She has had flatus. She has not had bowel movement.  Lochia Minimal.  Plan for birth control is  condoms.  Method of Feeding: breast and bottle.  Physical Exam:  General: alert, cooperative and appears stated age 2: appropriate Uterine Fundus: firm Incision: healing well DVT Evaluation: No evidence of DVT seen on physical exam.  H/H: Lab Results  Component Value Date/Time   HGB 10.3* 07/17/2015 07:48 AM   HCT 32.1* 07/17/2015 07:48 AM    Discharge Diagnoses: Term Pregnancy-delivered  Discharge Information: Date: 07/18/2015 Activity: pelvic rest Diet: routine  Medications: PNV and Percocet Breast feeding:  Yes Condition: stable Instructions: refer to handout Discharge to: home       Discharge Instructions    Discharge patient    Complete by:  As directed             Medication List    TAKE these medications        CONTOUR NEXT EZ MONITOR W/DEVICE Kit  1 each by Does not apply route 4 (four) times daily. Bayer Contour Next glucometer for new DX GDM O24.419 for testing 4 times daily     Fish Oil 1000 MG Caps  Take 1 capsule by mouth daily.     glucose blood test strip  Use as instructed     OVER THE COUNTER MEDICATION  2 drops 2 (two) times daily. Over the counter eye drops     oxyCODONE-acetaminophen 5-325 MG per tablet  Commonly known as:  PERCOCET/ROXICET  Take 1 tablet by mouth every 6 (six) hours as needed for moderate pain or severe pain.     prenatal multivitamin Tabs tablet  Take 1 tablet by mouth daily at 12 noon.       Follow-up Information    Follow up with [facility]. Schedule an appointment as soon as possible for a visit in 6 weeks.   Specialty:  Obstetrics and Gynecology   Why:  for postpartum visit   Contact information:   [address] [phone]      Reyes Ivan ,MD 07/18/2015,7:07 AM

## 2015-07-18 NOTE — Discharge Instructions (Signed)

## 2015-07-18 NOTE — Lactation Note (Signed)
This note was copied from the chart of Brenda Adreena Willits. Lactation Consultation Note  Mother had breast abscess on left breast with tissue removed and feels it is more difficult to latch on L- side.  Mother continues to attempt and is pumping.  Last pumping she expressed 15 ml. Advised to BF 1st before giving any supplement to encourage milk production.   Discussed supply and demand. Suggest to post pump 10-15 min, 4-6 times a day and give baby back any volume pumped. Faxed [facility] referral for pump and provided her w/ paperwork for loaner pump. Reviewed engorgement care and monitoring voids/stools. LC left phone number to call.    Patient Name: Brenda Solomon [id number] Date: 07/18/2015 Reason for consult: Late preterm infant;Follow-up assessment   Maternal Data    Feeding Feeding Type: Breast Fed Length of feed: 15 min  LATCH Score/Interventions                      Lactation Tools Discussed/Used     Consult Status Consult Status: Follow-up Date: 07/18/15 Follow-up type: In-patient    Dahlia Byes [facility] 07/18/2015, 9:21 AM

## 2015-08-18 ENCOUNTER — Ambulatory Visit: Payer: BLUE CROSS/BLUE SHIELD | Admitting: Obstetrics & Gynecology

## 2015-08-20 ENCOUNTER — Encounter: Payer: Self-pay | Admitting: Obstetrics & Gynecology

## 2015-08-20 ENCOUNTER — Ambulatory Visit (INDEPENDENT_AMBULATORY_CARE_PROVIDER_SITE_OTHER): Payer: BLUE CROSS/BLUE SHIELD | Admitting: Obstetrics & Gynecology

## 2015-08-20 ENCOUNTER — Other Ambulatory Visit: Payer: Self-pay | Admitting: Obstetrics & Gynecology

## 2015-08-20 NOTE — Progress Notes (Signed)
Subjective:     Lake Breeding is a 34 y.o. female who presents for a postpartum visit. She is 5 weeks postpartum following a c-section. I have fully reviewed the prenatal and intrapartum course. The delivery was at 36 gestational weeks. Outcome: repeat cesarean section, low transverse incision. Anesthesia: spinal. Postpartum course has been uncomplicated. Baby's course has been unremarkable. Baby is breastfeeding. Bleeding no bleeding. Bowel function is normal. Bladder function is normal. Patient is not sexually active. Contraception method is condoms. Postpartum depression screening: negative.  The following portions of the patient's history were reviewed and updated as appropriate: allergies, current medications, past family history, past medical history, past social history, past surgical history and problem list.  Review of Systems Pertinent items are noted in HPI.   Objective:    BP 99/68 mmHg  Pulse 72  Temp(Src) 98.1 F (36.7 C) (Oral)  Ht  (1.549 m)  Wt 139 lb 3.2 oz (63.141 kg)  BMI 26.32 kg/m2  Breastfeeding? Yes       Pt in NAD Abd; soft, NT, ND. Well healed transverse scar.  Suture removed from right edge of incision   Assessment:     4-5 postpartum exam. Pap smear not done at today's visit.   Plan:    1. Contraception: condoms 2. Follow up in: 1 years or as needed.   3. Gradual return to full activity  Waverly Tarquinio L. Harraway-Smith, M.D., [place]

## 2015-08-20 NOTE — Patient Instructions (Signed)
Contraception Choices Contraception (birth control) is the use of any methods or devices to prevent pregnancy. Below are some methods to help avoid pregnancy. HORMONAL METHODS   Contraceptive implant. This is a thin, plastic tube containing progesterone hormone. It does not contain estrogen hormone. Your health care provider inserts the tube in the inner part of the upper arm. The tube can remain in place for up to 3 years. After 3 years, the implant must be removed. The implant prevents the ovaries from releasing an egg (ovulation), thickens the cervical mucus to prevent sperm from entering the uterus, and thins the lining of the inside of the uterus.  Progesterone-only injections. These injections are given every 3 months by your health care provider to prevent pregnancy. This synthetic progesterone hormone stops the ovaries from releasing eggs. It also thickens cervical mucus and changes the uterine lining. This makes it harder for sperm to survive in the uterus.  Birth control pills. These pills contain estrogen and progesterone hormone. They work by preventing the ovaries from releasing eggs (ovulation). They also cause the cervical mucus to thicken, preventing the sperm from entering the uterus. Birth control pills are prescribed by a health care provider.Birth control pills can also be used to treat heavy periods.  Minipill. This type of birth control pill contains only the progesterone hormone. They are taken every day of each month and must be prescribed by your health care provider.  Birth control patch. The patch contains hormones similar to those in birth control pills. It must be changed once a week and is prescribed by a health care provider.  Vaginal ring. The ring contains hormones similar to those in birth control pills. It is left in the vagina for 3 weeks, removed for 1 week, and then a new one is put back in place. The patient must be comfortable inserting and removing the ring  from the vagina.A health care provider's prescription is necessary.  Emergency contraception. Emergency contraceptives prevent pregnancy after unprotected sexual intercourse. This pill can be taken right after sex or up to 5 days after unprotected sex. It is most effective the sooner you take the pills after having sexual intercourse. Most emergency contraceptive pills are available without a prescription. Check with your pharmacist. Do not use emergency contraception as your only form of birth control. BARRIER METHODS   Female condom. This is a thin sheath (latex or rubber) that is worn over the penis during sexual intercourse. It can be used with spermicide to increase effectiveness.  Female condom. This is a soft, loose-fitting sheath that is put into the vagina before sexual intercourse.  Diaphragm. This is a soft, latex, dome-shaped barrier that must be fitted by a health care provider. It is inserted into the vagina, along with a spermicidal jelly. It is inserted before intercourse. The diaphragm should be left in the vagina for 6 to 8 hours after intercourse.  Cervical cap. This is a round, soft, latex or plastic cup that fits over the cervix and must be fitted by a health care provider. The cap can be left in place for up to 48 hours after intercourse.  Sponge. This is a soft, circular piece of polyurethane foam. The sponge has spermicide in it. It is inserted into the vagina after wetting it and before sexual intercourse.  Spermicides. These are chemicals that kill or block sperm from entering the cervix and uterus. They come in the form of creams, jellies, suppositories, foam, or tablets. They do not require a   prescription. They are inserted into the vagina with an applicator before having sexual intercourse. The process must be repeated every time you have sexual intercourse. INTRAUTERINE CONTRACEPTION  Intrauterine device (IUD). This is a T-shaped device that is put in a woman's uterus  during a menstrual period to prevent pregnancy. There are 2 types:  Copper IUD. This type of IUD is wrapped in copper wire and is placed inside the uterus. Copper makes the uterus and fallopian tubes produce a fluid that kills sperm. It can stay in place for 10 years.  Hormone IUD. This type of IUD contains the hormone progestin (synthetic progesterone). The hormone thickens the cervical mucus and prevents sperm from entering the uterus, and it also thins the uterine lining to prevent implantation of a fertilized egg. The hormone can weaken or kill the sperm that get into the uterus. It can stay in place for 3-5 years, depending on which type of IUD is used. PERMANENT METHODS OF CONTRACEPTION  Female tubal ligation. This is when the woman's fallopian tubes are surgically sealed, tied, or blocked to prevent the egg from traveling to the uterus.  Hysteroscopic sterilization. This involves placing a small coil or insert into each fallopian tube. Your doctor uses a technique called hysteroscopy to do the procedure. The device causes scar tissue to form. This results in permanent blockage of the fallopian tubes, so the sperm cannot fertilize the egg. It takes about 3 months after the procedure for the tubes to become blocked. You must use another form of birth control for these 3 months.  Female sterilization. This is when the female has the tubes that carry sperm tied off (vasectomy).This blocks sperm from entering the vagina during sexual intercourse. After the procedure, the man can still ejaculate fluid (semen). NATURAL PLANNING METHODS  Natural family planning. This is not having sexual intercourse or using a barrier method (condom, diaphragm, cervical cap) on days the woman could become pregnant.  Calendar method. This is keeping track of the length of each menstrual cycle and identifying when you are fertile.  Ovulation method. This is avoiding sexual intercourse during ovulation.  Symptothermal  method. This is avoiding sexual intercourse during ovulation, using a thermometer and ovulation symptoms.  Post-ovulation method. This is timing sexual intercourse after you have ovulated. Regardless of which type or method of contraception you choose, it is important that you use condoms to protect against the transmission of sexually transmitted infections (STIs). Talk with your health care provider about which form of contraception is most appropriate for you. Document Released: 11/27/2005 Document Revised: 12/02/2013 Document Reviewed: 05/22/2013 ExitCare Patient Information 2015 [employer]. This information is not intended to replace advice given to you by your health care provider. Make sure you discuss any questions you have with your health care provider.  

## 2015-08-21 NOTE — H&P (Signed)
Obstetric Preoperative History and Physical  Brenda Solomon is a 34 y.o. G3P2001 with IUP at [redacted]w[redacted]d presenting for presenting for scheduled cesarean section for history of uterine rupture at 37wks with resulting IUFD.  No acute concerns.   Last drank at 10 AM- water Last food at 0600 AM  Clinic [facility] Prenatal Labs  Dating LMP c/w 8 week scan Blood type: A/POS/-- (02/04 1610)   Genetic Screen  Quad screen negative Antibody:NEG (02/04 1610)  Anatomic [place]  19 wks; left echogenic focus, otherwise nml Rubella: 4.10 (02/04 1610)  GTT Early:   198             Third trimester:  RPR: NON REAC (02/04 1610)   Flu vaccine 01/14/15 HBsAg: NEGATIVE (02/04 1610)   TDaP vaccine  05/27/15                                             Rhogam: HIV: NONREACTIVE (02/04 1610)   GBS     Pos in urine      GBS:  Pos in urine  Contraception Condoms Pap: Negative; GC/CT neg x 2  Baby Food Breast   Circumcision  Female   Pediatrician [facility]   Support Person Husband    Prenatal Course Source of Care: [facility]  with onset of care at 10 weeks Pregnancy complications or risks: Patient Active Problem List   Diagnosis Date Noted  . Status post C-section 07/15/2015  . Hx of intrauterine fetal death, currently pregnant   . Previous stillbirth or demise, antepartum 06/24/2015  . Diet controlled gestational diabetes mellitus in third trimester   . Supervision of high risk pregnancy in second trimester   . Advanced maternal age in multigravida   . Gestational diabetes mellitus in pregnancy 02/11/2015  . Group B streptococcus urinary tract infection affecting pregnancy, antepartum 01/16/2015  . Previous cesarean section  x 2 complicating pregnancy, antepartum condition or complication 01/14/2015  . Uterine rupture in previous pregnancy resulting in IUFD at 37 weeks 01/14/2015  . Cellulitis and abscess 02/25/2014  . Abscess of left breast s/p I&D 2/5 & 02/04/2014 02/02/2014   She plans to breastfeed She desires  condoms for postpartum contraception.   Prenatal labs and studies: ABO, Rh: --/--/A POS (08/03 0840) Antibody: NEG (08/03 0840) Rubella: 4.10 (02/04 1610) RPR: Non Reactive (08/03 0840)  HBsAg: NEGATIVE (02/04 1610)  HIV: NONREACTIVE (06/02 1153)  GBS: POS in Urine 1 hr Glucola  198 (early) Genetic screening normal Anatomy US normal  Prenatal Transfer Tool  Maternal Diabetes: Yes:  Diabetes Type:  Diet controlled Genetic Screening: Normal Maternal Ultrasounds/Referrals: Normal Fetal Ultrasounds or other Referrals:  None Maternal Substance Abuse:  No Significant Maternal Medications:  None Significant Maternal Lab Results: None  Past Medical History  Diagnosis Date  . Gestational diabetes 2013  . Abscess of breast 01/12/2014    Left breast. I&D 01/12/2014     Past Surgical History  Procedure Laterality Date  . Cesarean section  2012  . Cesarean section  02/16/2012    Procedure: CESAREAN SECTION;  Surgeon: Tereso Newcomer, MD;  Location: [facility];  Service: Gynecology;  Laterality: N/A;  Repair of Ruptured Uterus  . Breast surgery    . Irrigation and debridement abscess Left 02/03/2014    Procedure: IRRIGATION AND DEBRIDEMENT ABSCESS;  Surgeon: Romie Levee, MD;  Location: [facility];  Service: General;  Laterality: Left;  . Incision and drainage abscess Left 02/07/2014    Procedure: INCISION AND DRAINAGE ABSCESS LEFT BREAST;  Surgeon: Mariella Saa, MD;  Location: [facility];  Service: General;  Laterality: Left;  . Cesarean section N/A 07/15/2015    Procedure: CESAREAN SECTION;  Surgeon: Willodean Rosenthal, MD;  Location: [facility];  Service: Obstetrics;  Laterality: N/A;    OB History  Gravida Para Term Preterm AB SAB TAB Ectopic Multiple Living  0 2    # Outcome Date GA Lbr Len/2nd Weight Sex Delivery Anes PTL Lv  3 Preterm 07/15/15 [redacted]w[redacted]d  5 lb 7.5 oz (2.48 kg) F CS-LTranv Spinal  Y  2 Term 02/16/12 [redacted]w[redacted]d  5 lb 10.8 oz (2.575 kg) F CS-LTranv EPI  FD     Comments:  maternal uterine rupture, IUFD  1 Term 12/18/10 [redacted]w[redacted]d  5 lb 2 oz (2.325 kg) F CS-LTranv   Y     Comments: C-section due to being breech    Obstetric Comments  Gestational diabetes during first pregnancy, diet-controlled    Social History   Social History  . Marital Status: Married    Spouse Name: N/A  . Number of Children: 1  . Years of Education: college   Occupational History  .      college student   Social History Main Topics  . Smoking status: Never Smoker   . Smokeless tobacco: Never Used  . Alcohol Use: No  . Drug Use: No  . Sexual Activity: Yes    Birth Control/ Protection: None   Other Topics Concern  . None   Social History Narrative   Patient lives at home with husband  (Brenda Solomon). 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   Right handed.   Caffeine tea one daily hot tea.    Family History  Problem Relation Age of Onset  . Diabetes type II Mother   . Diabetes Mother   . Diabetes type II Father   . Diabetes Father     No prescriptions prior to admission   No Known Allergies  Review of Systems: Negative except for what is mentioned in HPI.  Physical Exam: BP 102/63 mmHg  Pulse 88  Temp(Src) 98.1 F (36.7 C) (Oral)  Resp 18  Ht  (1.575 m)  Wt 148 lb (67.132 kg)  BMI 27.06 kg/m2  SpO2 98%  LMP 11/05/2014 (Approximate)  Breastfeeding? Unknown CONSTITUTIONAL: Well-developed, well-nourished female in no acute distress.  HENT:  Normocephalic, atraumatic, External right and left ear normal. Oropharynx is clear and moist EYES: Conjunctivae and EOM are normal. Pupils are equal, round, and reactive to light. No scleral icterus.  NECK: Normal range of motion, supple, no masses SKIN: Skin is warm and dry. No rash noted. Not diaphoretic. No erythema. No pallor. NEUROLGIC: Alert and oriented to person, place, and time. Normal reflexes, muscle tone coordination. No cranial nerve deficit noted. PSYCHIATRIC: Normal mood and affect. Normal  behavior. Normal judgment and thought content. CARDIOVASCULAR: Normal heart rate noted, regular rhythm RESPIRATORY: Effort and breath sounds normal, no problems with respiration noted ABDOMEN: Soft, nontender, nondistended, gravid. Well-healed Pfannenstiel incision. PELVIC: Deferred MUSCULOSKELETAL: Normal range of motion. No edema and no tenderness. 2+ distal pulses.   Pertinent Labs/Studies:   No results found for this or any previous visit (from the past 72 hour(s)).  Assessment and Plan :Rayaan Lorah is a 34 y.o. G3P2001 at [redacted]w[redacted]d being admitted being admitted for  scheduled cesarean section for history of uterine rupture at 37 weeks resulting in IUFD. The risks of cesarean section discussed with the patient included but were not limited to: bleeding which may require transfusion or reoperation; infection which may require antibiotics; injury to bowel, bladder, ureters or other surrounding organs; injury to the fetus; need for additional procedures including hysterectomy in the event of a life-threatening hemorrhage; placental abnormalities wth subsequent pregnancies, incisional problems, thromboembolic phenomenon and other postoperative/anesthesia complications. The patient concurred with the proposed plan, giving informed written consent for the procedure. Patient has been NPO since last night she will remain NPO for procedure. Anesthesia and OR aware. Preoperative prophylactic antibiotics and SCDs ordered on call to the OR. To OR when ready.   Federico Flake, MD Family Practice OB Fellow  Faculty Practice, [facility]

## 2015-09-18 IMAGING — US US OB DETAIL+14 WK
1 series · 16 of 28 positions shown · non-contrast
Comparison: none

OBSTETRICS REPORT
                      (Signed Final 03/18/2015 [DATE])

Service(s) Provided
 US OB DETAIL + 14 WK                                  76811.0
Indications
 19 weeks gestation of pregnancy
 Detailed fetal anatomic survey                        [id number]
 Poor obstetric history: Previous IUFD (uterine
 rupture at 37 weeks)
 Gestational diabetes in pregnancy, diet controlled
 Advanced maternal age multigravida (34), second
 trimester - negative QUAD screen
 Previous cesarean delivery x2
 Poor obstetric history: Previous gestational
 diabetes
Fetal Evaluation
 Num Of Fetuses:    1
 Fetal Heart Rate:  150                          bpm
 Cardiac Activity:  Observed
 Presentation:      Cephalic
 Placenta:          Anterior, above cervical os
 P. Cord            Visualized, central
 Insertion:
 Amniotic Fluid
 AFI FV:      Subjectively within normal limits
                                             Larg Pckt:     4.5  cm
Biometry
 BPD:     40.1  mm     G. Age:  18w 1d                CI:         72.3   70 - 86
 OFD:     55.5  mm                                    FL/HC:      18.5   16.1 -
 HC:     155.4  mm     G. Age:  18w 3d       20  %    HC/AC:      1.18   1.09 -
 AC:     131.2  mm     G. Age:  18w 4d       34  %    FL/BPD:
 FL:      28.7  mm     G. Age:  18w 6d       37  %    FL/AC:      21.9   20 - 24
 HUM:     28.5  mm     G. Age:  19w 1d       56  %
 CER:     19.3  mm     G. Age:  18w 5d       40  %
 NFT:      2.8  mm
 Est. FW:     252  gm      0 lb 9 oz     38  %
Gestational Age
 LMP:           19w 0d        Date:  11/05/14                 EDD:   08/12/15
 U/S Today:     18w 4d                                        EDD:   08/15/15
 Best:          19w 0d     Det. By:  LMP  (11/05/14)          EDD:   08/12/15
Anatomy
 Cranium:          Appears normal         Aortic Arch:      Appears normal
 Fetal Cavum:      Appears normal         Ductal Arch:      Appears normal
 Ventricles:       Appears normal         Diaphragm:        Appears normal
 Choroid Plexus:   Appears normal         Stomach:          Appears normal, left
                                                            sided
 Cerebellum:       Appears normal         Abdomen:          Appears normal
 Posterior Fossa:  Appears normal         Abdominal Wall:   Appears nml (cord
                                                            insert, abd wall)
 Nuchal Fold:      Appears normal         Cord Vessels:     Appears normal (3
                                                            vessel cord)
 Face:             Appears normal         Kidneys:          Appear normal
                   (orbits and profile)
 Lips:             Appears normal         Bladder:          Appears normal
 Palate:           Appears normal         Spine:            Appears normal
 Heart:            Echogenic focus        Lower             Appears normal
                   in LV, NL 4CH
                                          Extremities:
 RVOT:             Appears normal         Upper             Appears normal
 LVOT:             Appears normal
 Other:  Fetus appears to be a female. Heels and 5th digit visualized. Nasal
         bone visualized.
Targeted Anatomy
 Fetal Central Nervous System
 Cisterna Magna:
Cervix Uterus Adnexa
 Cervical Length:    3.5      cm
 Cervix:       Normal appearance by transabdominal scan.
 Left Ovary:    Within normal limits.
 Right Ovary:   Within normal limits.
 Adnexa:     No abnormality visualized.
Impression
INDICATION: 34 yr old [id number] at 70w2d with gestational
 diabetes and history of uterine rupture with fetal demise for
 fetal anatomic survey.

[Series 1: us ob detail+14 wk · 0.23mm/px · 16 of 85 slices shown]
[im 1/85]
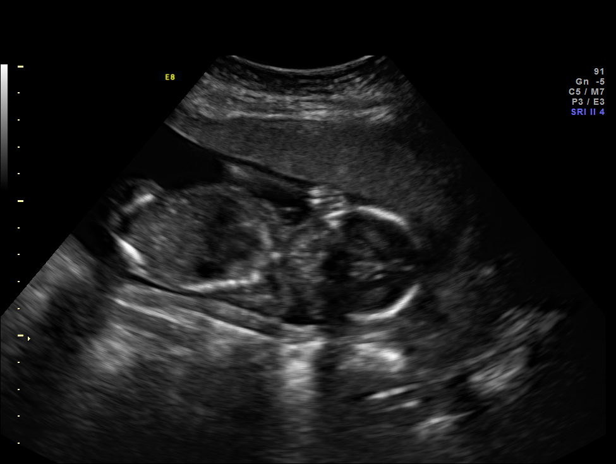
[im 7/85]
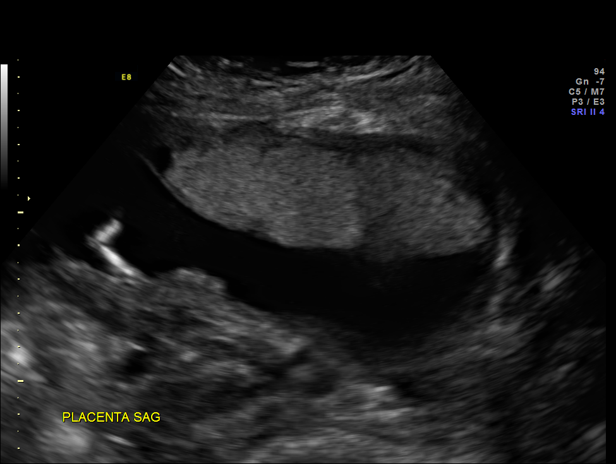
[im 13/85]
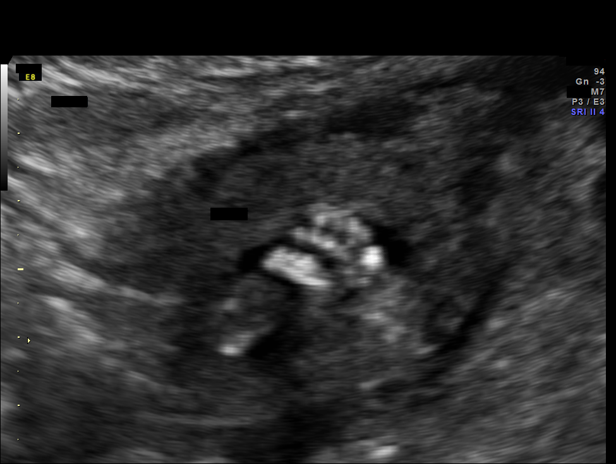
[im 19/85]
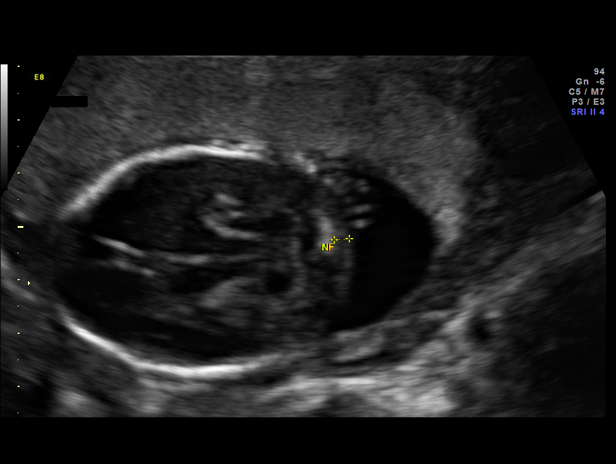
[im 22/85]
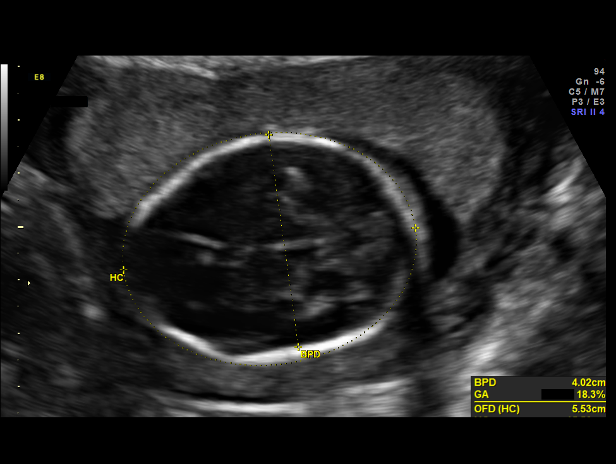
[im 29/85]
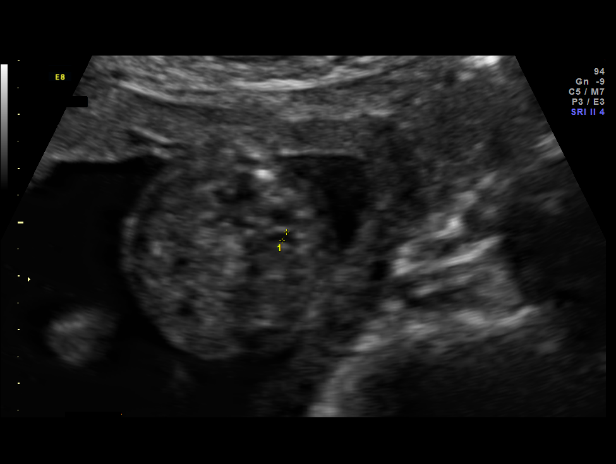
[im 35/85]
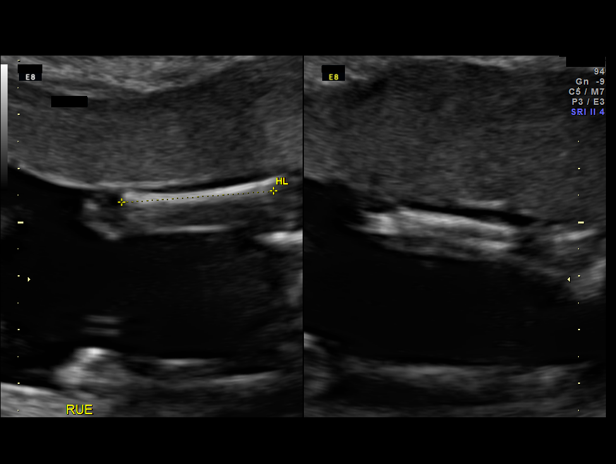
[im 41/85]
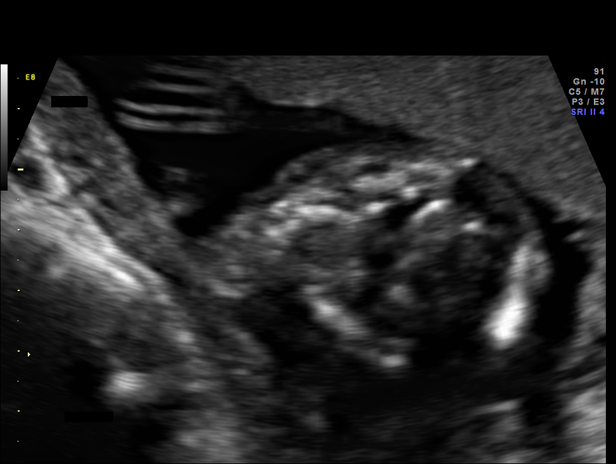
[im 44/85]
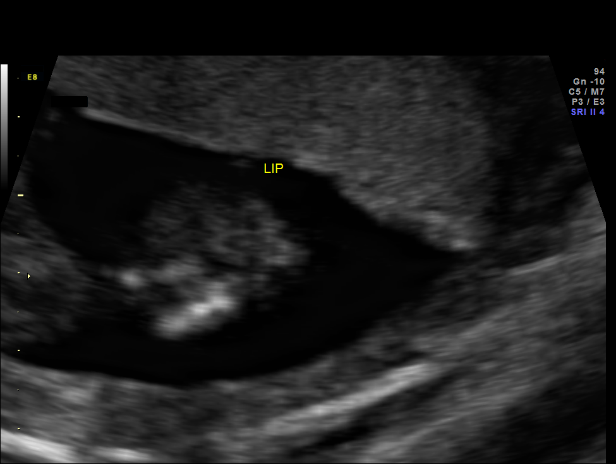
[im 50/85]
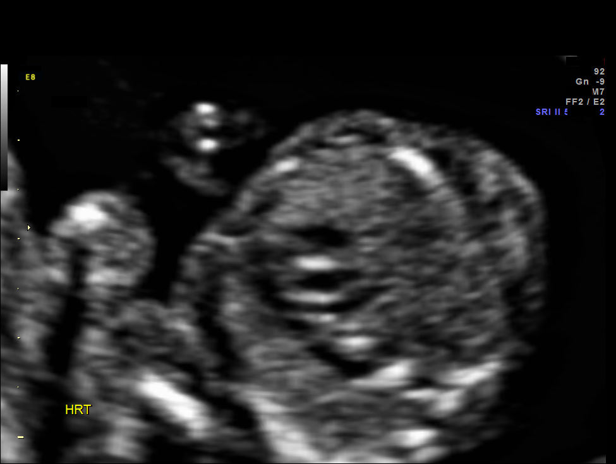
[im 57/85]
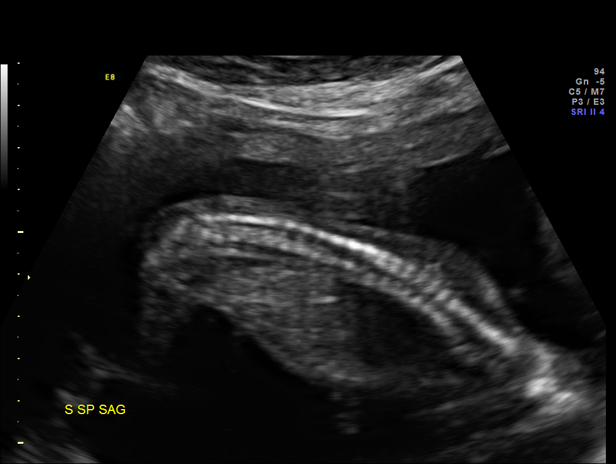
[im 63/85]
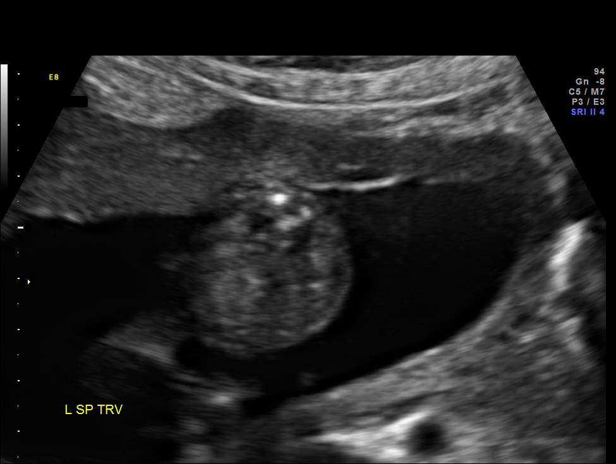
[im 66/85]
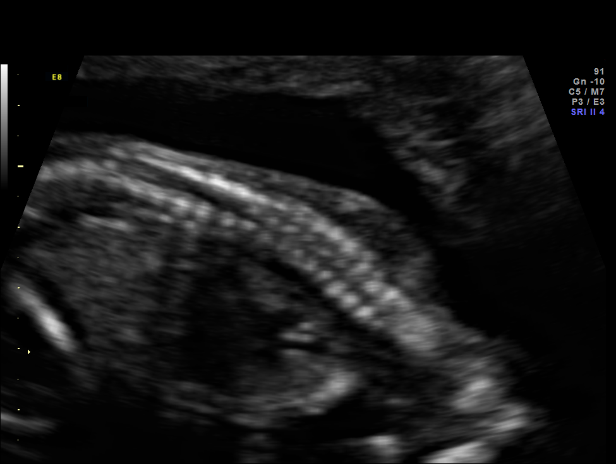
[im 72/85]
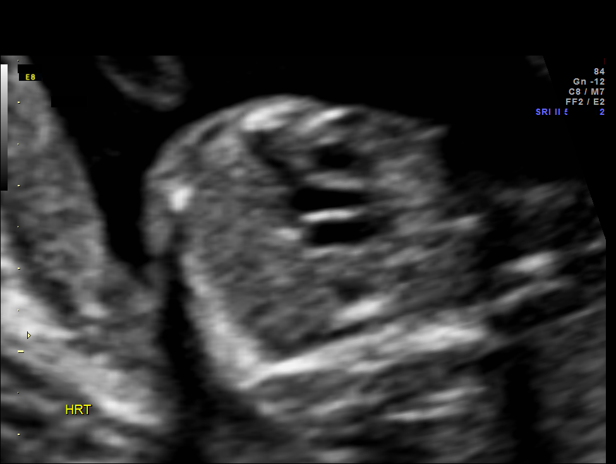
[im 78/85]
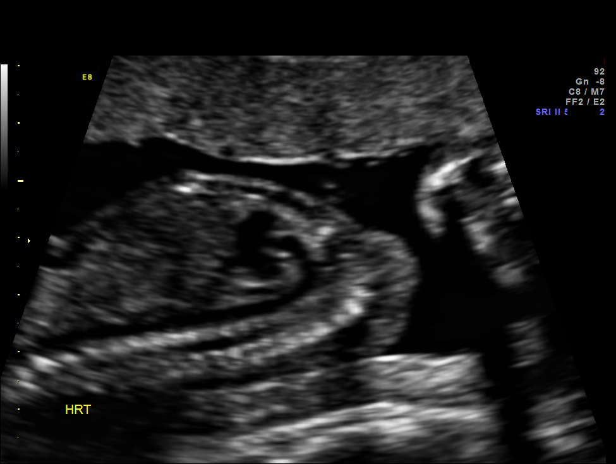
[im 85/85]
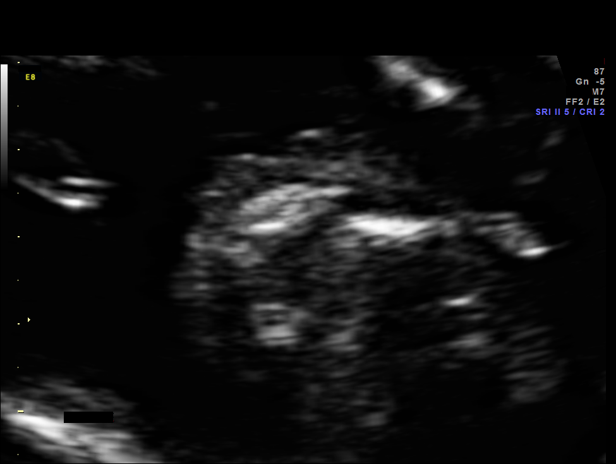

[16 of 28 positions shown; findings below may reference images not displayed]

FINDINGS: 1. Single intrauterine pregnancy.
 2. Fetal biometry is consistent with dating.
 3. Anterior placenta without evidence of previa.
 4. Normal amniotic fluid volume.
 5. Normal transabdominal cervical length.
 6. There is an echogenic focus in the left ventricle.
 7. The remainder of the anatomic survey is normal.
Recommendations

 1. Appropriate fetal growth.
 2. Normal fetal anatomic survey.
 3. Echogenic focus in the left ventricle:
 - discussed the association with increased risk of trisomy 12
 - however discussed when found in isolation with normal
 anueploidy screening the increase in risk is not significant
 4. Advanced maternal age:
 - previously counseled by primary OB
 - had normal quad screen
 5. Gestational diabetes:
 - possible type II diabetes given early diagnosis
 - diet controlled
 - recommend serial fetal growth
 - recommend antenatal testing starting at 32 weeks
 - cardiac views cleared on today's exam
 6. History of uterine rupture with fetal demise:
 - no consult requested
 - recommend fetal surveillance as above
 - per chart plan on repeat C section at 36 weeks

 questions or concerns.

## 2015-11-07 IMAGING — US US OB FOLLOW-UP
1 series · 12 of 28 positions shown · non-contrast
Comparison: none

[Series 1: us ob follow-up · 0.23mm/px · 12 of 37 slices shown]
[im 2/37]
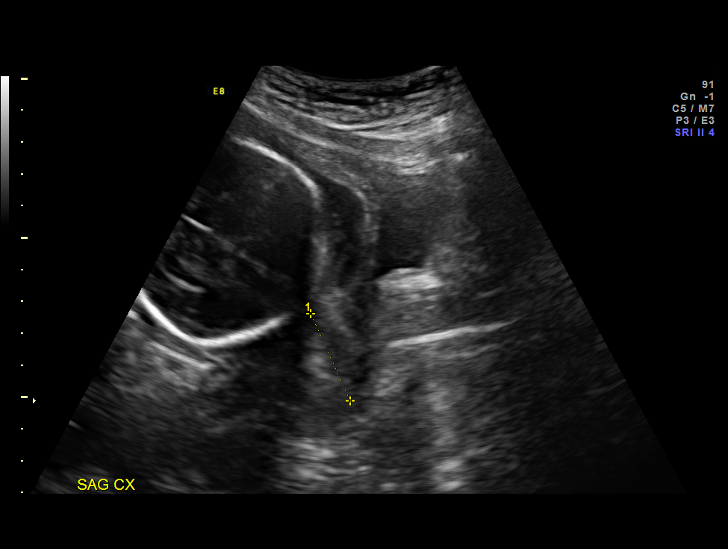
[im 5/37]
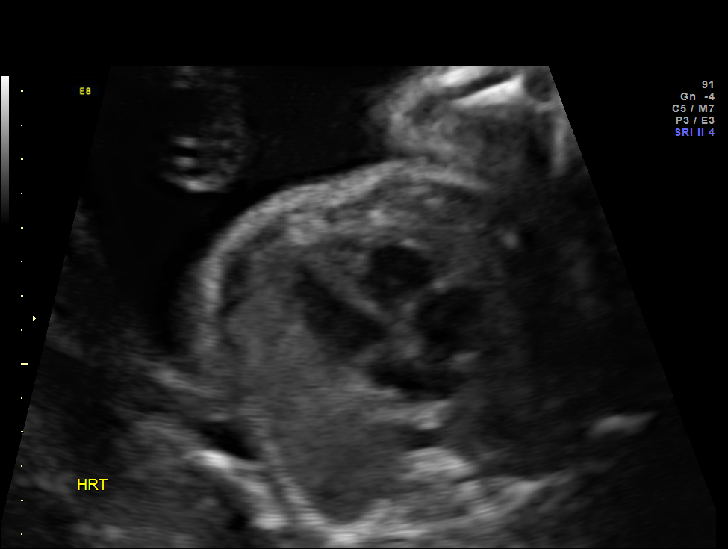
[im 7/37]
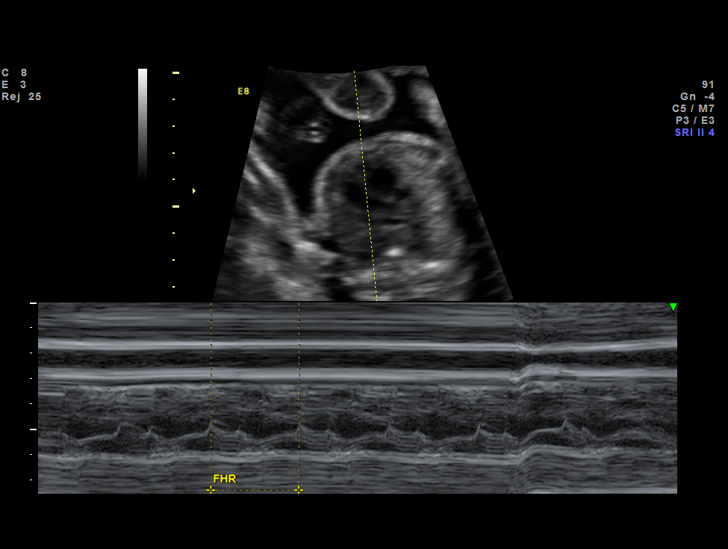
[im 11/37]
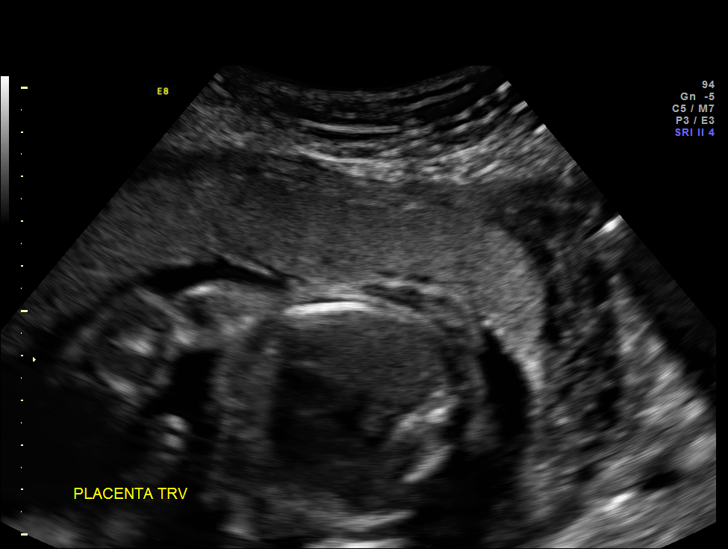
[im 14/37]
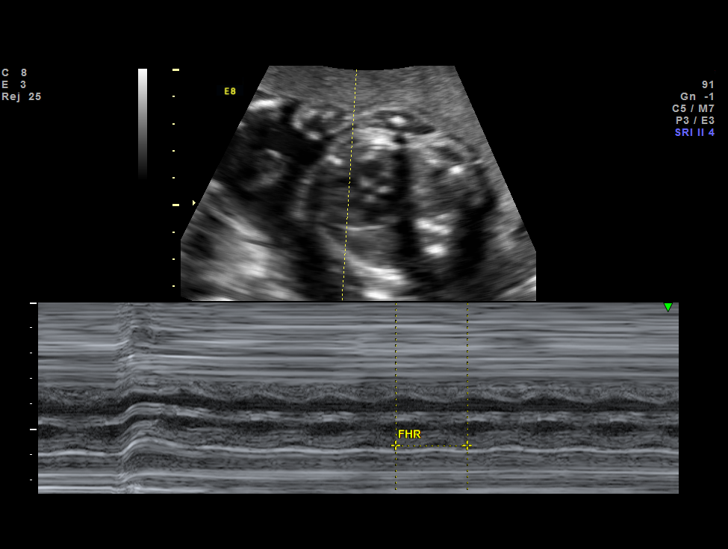
[im 17/37]
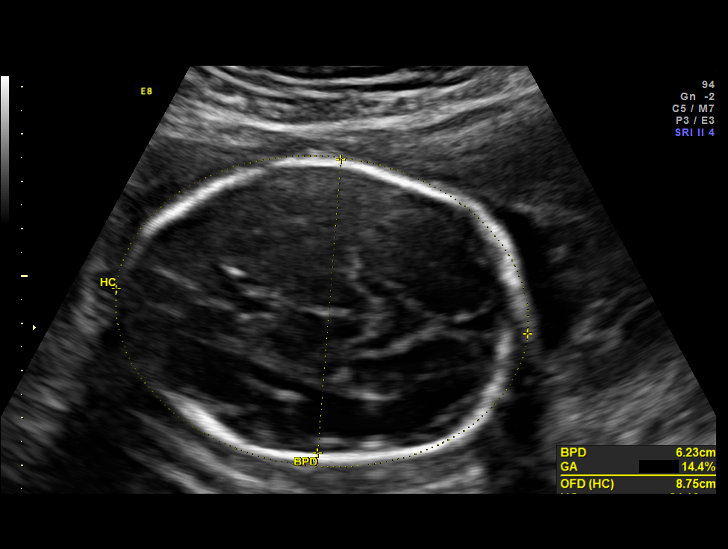
[im 21/37]
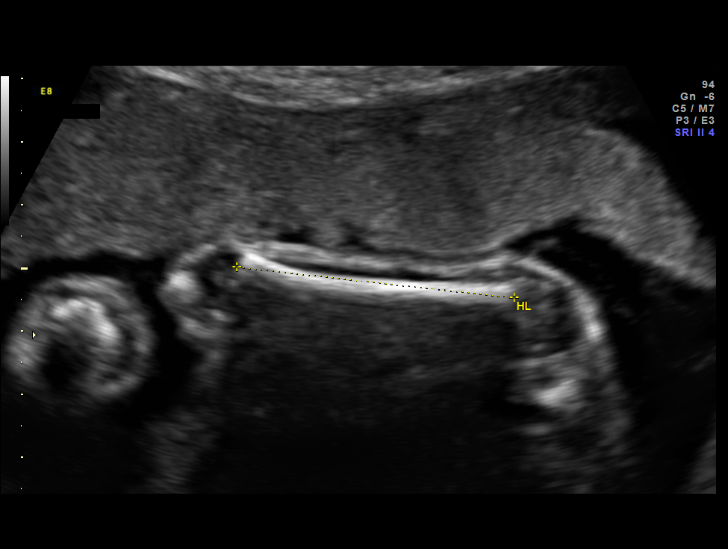
[im 23/37]
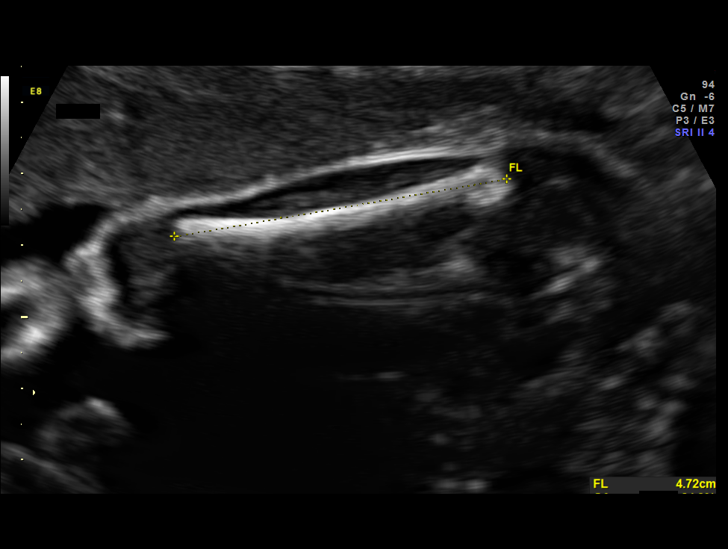
[im 26/37]
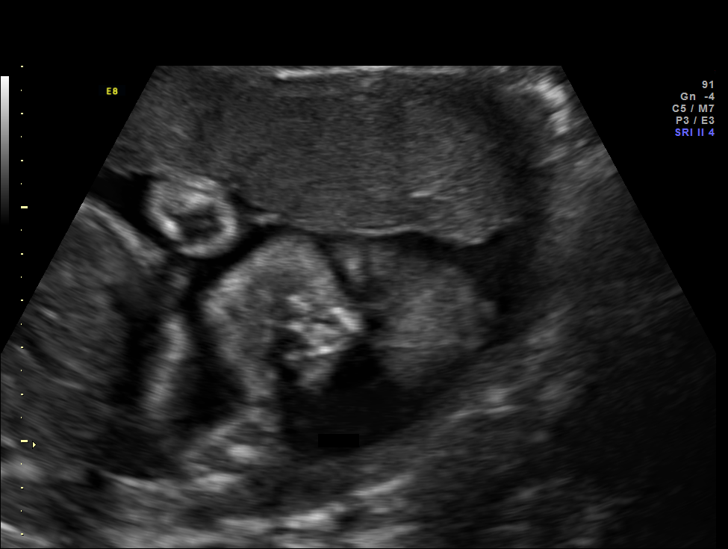
[im 30/37]
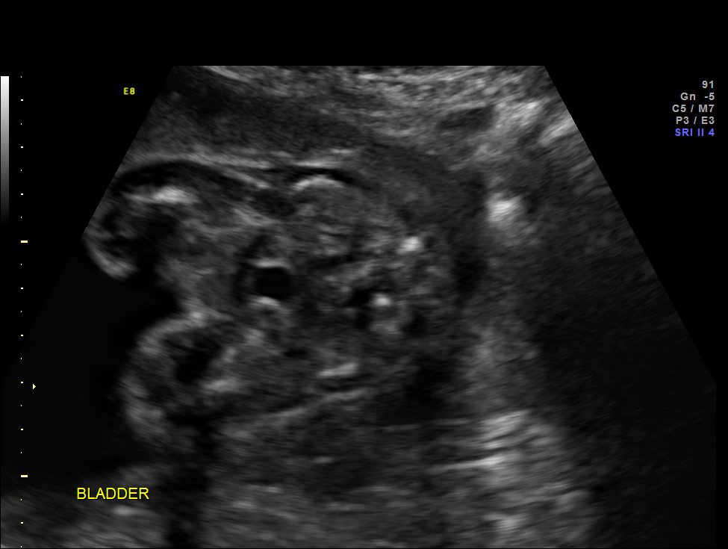
[im 33/37]
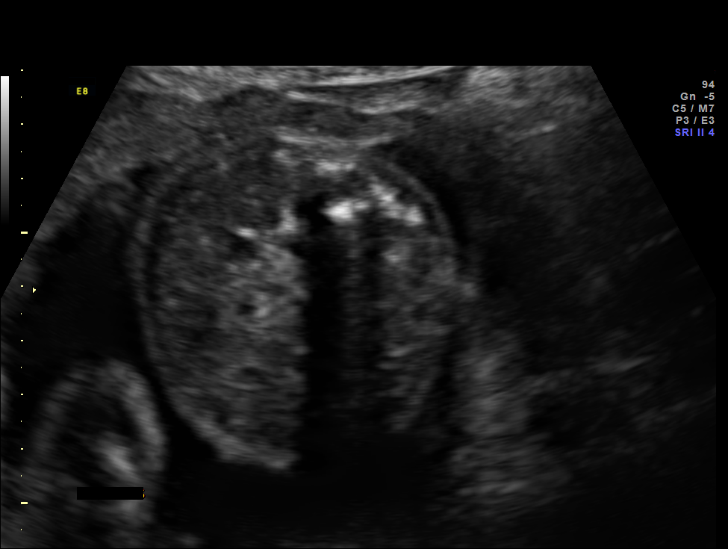
[im 35/37]
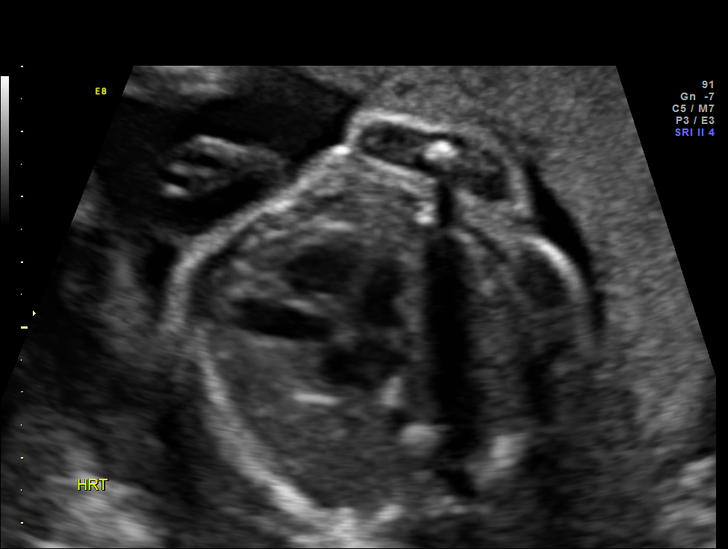

[12 of 28 positions shown; findings below may reference images not displayed]

OBSTETRICS REPORT
(Signed Final 05/07/2015 [DATE])

Service(s) Provided

US OB FOLLOW UP                                       76816.1
Indications

26 weeks gestation of pregnancy
Gestational diabetes in pregnancy, diet controlled
Poor obstetric history: Previous IUFD (uterine
rupture at 37 weeks)
Echogenic intracardiac focus of the heart  (EIF)
Advanced maternal age multigravida (34), second
trimester - negative QUAD screen
Previous cesarean delivery x2
Poor obstetric history: Previous gestational
diabetes
Fetal Evaluation

Num Of Fetuses:    1
Fetal Heart Rate:  128                          bpm
Cardiac Activity:  Observed
Presentation:      Cephalic
Placenta:          Anterior, above cervical os
P. Cord            Visualized, central
Insertion:

Amniotic Fluid
AFI FV:      Subjectively within normal limits
Larg Pckt:     6.8  cm
Biometry

BPD:     62.5  mm     G. Age:  25w 2d                CI:         72.6   70 - 86
OFD:     86.1  mm                                    FL/HC:      19.5   18.6 -
20.4
HC:     239.8  mm     G. Age:  26w 1d       23  %    HC/AC:      1.10   1.04 -
1.22
AC:     218.5  mm     G. Age:  26w 2d       47  %    FL/BPD:     74.9   71 - 87
FL:      46.8  mm     G. Age:  25w 4d       21  %    FL/AC:      21.4   20 - 24
HUM:     43.8  mm     G. Age:  26w 0d       45  %
CER:     29.5  mm     G. Age:  26w 2d       53  %

Est. FW:     876  gm    1 lb 15 oz      51  %
Gestational Age
LMP:           26w 1d        Date:  11/05/14                 EDD:   08/12/15
U/S Today:     25w 6d                                        EDD:   08/14/15
Best:          26w 1d     Det. By:  LMP  (11/05/14)          EDD:   08/12/15
Anatomy

Cranium:          Appears normal         Aortic Arch:      Previously seen
Fetal Cavum:      Appears normal         Ductal Arch:      Previously seen
Ventricles:       Appears normal         Diaphragm:        Previously seen
Choroid Plexus:   Previously seen        Stomach:          Appears normal, left
sided
Cerebellum:       Appears normal         Abdomen:          Appears normal
Posterior Fossa:  Appears normal         Abdominal Wall:   Previously seen
Nuchal Fold:      Previously seen        Cord Vessels:     Previously seen
Face:             Orbits and profile     Kidneys:          Appear normal
previously seen
Lips:             Previously seen        Bladder:          Appears normal
Palate:           Previously seen        Spine:            Previously seen
Heart:            Appears normal         Lower             Previously seen
(4CH, axis, and        Extremities:
situs)
RVOT:             Previously seen        Upper             Previously seen
Extremities:
LVOT:             Previously seen

Other:  Fetus appears to be a female. Heels and 5th digit previously
visualized. Nasal bone previously visualized.
Targeted Anatomy

Fetal Central Nervous System
Cisterna Magna:
Cervix Uterus Adnexa

Cervical Length:    3        cm

Cervix:       Normal appearance by transabdominal scan.
Left Ovary:    Within normal limits.
Right Ovary:   Within normal limits.
Adnexa:     No abnormality visualized.
Impression

SIUP at 26+1 weeks
Normal interval anatomy; anatomic survey complete
Normal amniotic fluid volume
Appropriate interval growth with EFW at the 51st %tile
Recommendations

Follow-up ultrasound for growth in 6 weeks

questions or concerns.
